# Patient Record
Sex: Male | Born: 1955 | Race: White | Hispanic: Yes | Marital: Married | Smoking: Never smoker
Health system: Southern US, Community
[De-identification: ages and names within clinical notes are randomized; demographics above are authoritative.]

## PROBLEM LIST (undated history)

## (undated) DIAGNOSIS — N400 Enlarged prostate without lower urinary tract symptoms: Secondary | ICD-10-CM

## (undated) DIAGNOSIS — N200 Calculus of kidney: Secondary | ICD-10-CM

## (undated) HISTORY — DX: Benign prostatic hyperplasia without lower urinary tract symptoms: N40.0

## (undated) HISTORY — DX: Calculus of kidney: N20.0

---

## 2015-10-09 ENCOUNTER — Encounter (HOSPITAL_COMMUNITY): Payer: Self-pay

## 2015-10-09 ENCOUNTER — Emergency Department (HOSPITAL_COMMUNITY)
Admission: EM | Admit: 2015-10-09 | Discharge: 2015-10-09 | Disposition: A | Discharge status: Home / Self Care | Payer: BLUE CROSS/BLUE SHIELD | Attending: Emergency Medicine | Admitting: Emergency Medicine

## 2015-10-09 ENCOUNTER — Emergency Department (HOSPITAL_COMMUNITY): Payer: BLUE CROSS/BLUE SHIELD

## 2015-10-09 DIAGNOSIS — R109 Unspecified abdominal pain: Secondary | ICD-10-CM | POA: Diagnosis not present

## 2015-10-09 DIAGNOSIS — N200 Calculus of kidney: Secondary | ICD-10-CM | POA: Insufficient documentation

## 2015-10-09 LAB — COMPREHENSIVE METABOLIC PANEL
ALK PHOS: 58 U/L (ref 38–126)
ALT: 37 U/L (ref 17–63)
AST: 29 U/L (ref 15–41)
Albumin: 4.2 g/dL (ref 3.5–5.0)
Anion gap: 6 (ref 5–15)
BUN: 14 mg/dL (ref 6–20)
CALCIUM: 9 mg/dL (ref 8.9–10.3)
CHLORIDE: 105 mmol/L (ref 101–111)
CO2: 29 mmol/L (ref 22–32)
CREATININE: 1.29 mg/dL — AB (ref 0.61–1.24)
GFR calc non Af Amer: 59 mL/min — ABNORMAL LOW (ref >60)
Glucose, Bld: 109 mg/dL — ABNORMAL HIGH (ref 65–99)
Potassium: 3.8 mmol/L (ref 3.5–5.1)
SODIUM: 140 mmol/L (ref 135–145)
Total Bilirubin: 0.7 mg/dL (ref 0.3–1.2)
Total Protein: 7.1 g/dL (ref 6.5–8.1)

## 2015-10-09 LAB — URINE MICROSCOPIC-ADD ON

## 2015-10-09 LAB — URINALYSIS, ROUTINE W REFLEX MICROSCOPIC
BILIRUBIN URINE: NEGATIVE
GLUCOSE, UA: NEGATIVE mg/dL
KETONES UR: NEGATIVE mg/dL
Leukocytes, UA: NEGATIVE
Nitrite: NEGATIVE
PROTEIN: NEGATIVE mg/dL
Specific Gravity, Urine: 1.017 (ref 1.005–1.030)
pH: 7.5 (ref 5.0–8.0)

## 2015-10-09 LAB — CBC
HCT: 47.2 % (ref 39.0–52.0)
Hemoglobin: 15.1 g/dL (ref 13.0–17.0)
MCH: 31.1 pg (ref 26.0–34.0)
MCHC: 32 g/dL (ref 30.0–36.0)
MCV: 97.3 fL (ref 78.0–100.0)
PLATELETS: 200 10*3/uL (ref 150–400)
RBC: 4.85 MIL/uL (ref 4.22–5.81)
RDW: 12.2 % (ref 11.5–15.5)
WBC: 11.2 10*3/uL — ABNORMAL HIGH (ref 4.0–10.5)

## 2015-10-09 LAB — LIPASE, BLOOD: Lipase: 38 U/L (ref 11–51)

## 2015-10-09 MED ORDER — OXYCODONE-ACETAMINOPHEN 5-325 MG PO TABS
1.0000 | ORAL_TABLET | ORAL | Status: DC | PRN
  Administered 2015-10-09: 1 via ORAL

## 2015-10-09 MED ORDER — ONDANSETRON 4 MG PO TBDP: 4.0000 mg | ORAL_TABLET | Freq: Three times a day (TID) | ORAL | 0 refills | Status: DC | PRN

## 2015-10-09 MED ORDER — HYDROCODONE-ACETAMINOPHEN 5-325 MG PO TABS: 1.0000 | ORAL_TABLET | ORAL | 0 refills | Status: DC | PRN

## 2015-10-09 MED ORDER — SODIUM CHLORIDE 0.9 % IV BOLUS (SEPSIS)
1000.0000 mL | Freq: Once | INTRAVENOUS | Status: AC
  Administered 2015-10-09: 1000 mL via INTRAVENOUS

## 2015-10-09 MED ORDER — MORPHINE SULFATE (PF) 4 MG/ML IV SOLN
4.0000 mg | Freq: Once | INTRAVENOUS | Status: AC
  Administered 2015-10-09: 4 mg via INTRAVENOUS
  Filled 2015-10-09: qty 1

## 2015-10-09 MED ORDER — ONDANSETRON HCL 4 MG/2ML IJ SOLN
4.0000 mg | Freq: Once | INTRAMUSCULAR | Status: AC
  Administered 2015-10-09: 4 mg via INTRAVENOUS
  Filled 2015-10-09: qty 2

## 2015-10-09 MED ORDER — OXYCODONE-ACETAMINOPHEN 5-325 MG PO TABS
ORAL_TABLET | ORAL | Status: AC
  Filled 2015-10-09: qty 1

## 2015-10-09 MED ORDER — HYDROMORPHONE HCL 1 MG/ML IJ SOLN: 1.0000 mg | Freq: Once | INTRAMUSCULAR | Status: DC

## 2015-10-09 NOTE — ED Provider Notes (Signed)
[facility]-EMER[facility]ENCY DEPT Provider Note   CSN: [id number] Arrival date & time: 10/09/15  1729     History   Chief Complaint Chief Complaint  Patient presents with  . Flank Pain    HPI Darren Nelson is a 60 y.o. male.  Pt has a hx of kidney stones and has had right sided flank pain since yesterday.  He took ibuprofen without help.  He has had some n/v.      History reviewed. No pertinent past medical history.  There are no active problems to display for this patient.   History reviewed. No pertinent surgical history.     Home Medications    Prior to Admission medications   Medication Sig Start Date End Date Taking? Authorizing Provider  HYDROcodone-acetaminophen (NORCO/VICODIN) 5-325 MG tablet Take 1 tablet by mouth every 4 (four) hours as needed. 10/09/15   Jacalyn LefevreJulie Sohail Capraro, MD  ondansetron (ZOFRAN ODT) 4 MG disintegrating tablet Take 1 tablet (4 mg total) by mouth every 8 (eight) hours as needed for nausea or vomiting. 10/09/15   Jacalyn LefevreJulie Oshen Wlodarczyk, MD    Family History History reviewed. No pertinent family history.  Social History Social History  Substance Use Topics  . Smoking status: Never Smoker  . Smokeless tobacco: Never Used  . Alcohol use No     Allergies   Review of patient's allergies indicates no known allergies.   Review of Systems Review of Systems  Gastrointestinal: Positive for abdominal pain, nausea and vomiting.  Genitourinary: Positive for flank pain.  All other systems reviewed and are negative.    Physical Exam Updated Vital Signs BP 122/77   Pulse 62   Temp 97.8 F (36.6 C) (Oral)   Resp 18   SpO2 98%   Physical Exam  Constitutional: He is oriented to person, place, and time. He appears well-developed and well-nourished. He appears distressed.  HENT:  Head: Normocephalic and atraumatic.  Right Ear: External ear normal.  Left Ear: External ear normal.  Nose: Nose normal.  Mouth/Throat: Oropharynx is clear and moist.    Eyes: Conjunctivae and EOM are normal. Pupils are equal, round, and reactive to light.  Neck: Normal range of motion. Neck supple.  Cardiovascular: Normal rate, regular rhythm, normal heart sounds and intact distal pulses.   Pulmonary/Chest: Effort normal and breath sounds normal.  Abdominal: Soft. Bowel sounds are normal.  Musculoskeletal: Normal range of motion.  Neurological: He is alert and oriented to person, place, and time.  Skin: Skin is warm and dry.  Psychiatric: He has a normal mood and affect. His behavior is normal. Judgment and thought content normal.  Nursing note and vitals reviewed.    ED Treatments / Results  Labs (all labs ordered are listed, but only abnormal results are displayed) Labs Reviewed  COMPREHENSIVE METABOLIC PANEL - Abnormal; Notable for the following:       Result Value   Glucose, Bld 109 (*)    Creatinine, Ser 1.29 (*)    GFR calc non Af Amer 59 (*)    All other components within normal limits  CBC - Abnormal; Notable for the following:    WBC 11.2 (*)    All other components within normal limits  URINALYSIS, ROUTINE W REFLEX MICROSCOPIC (NOT AT [facility]) - Abnormal; Notable for the following:    Hgb urine dipstick LARGE (*)    All other components within normal limits  URINE MICROSCOPIC-ADD ON - Abnormal; Notable for the following:    Squamous Epithelial / LPF 0-5 (*)  Bacteria, UA RARE (*)    All other components within normal limits  LIPASE, BLOOD    EKG  EKG Interpretation None       Radiology Ct Renal Stone Study  Result Date: 10/09/2015 CLINICAL DATA:  Right flank pain and vomiting since this morning. Initial encounter. EXAM: CT ABDOMEN AND PELVIS WITHOUT CONTRAST TECHNIQUE: Multidetector CT imaging of the abdomen and pelvis was performed following the standard protocol without IV contrast. COMPARISON:  None. FINDINGS: There is dependent atelectasis in the lung bases. Heart size is enlarged. No pleural or pericardial effusion. 2-3  punctate nonobstructing right renal stones are identified. Multiple small nonobstructing left renal stones are also seen. Stones on the left measure up to 0.4 cm in diameter. There is mild stranding about the right ureter but no ureteral stone is identified and there is only minimal fullness of the right intrarenal collecting system. A 0.4 cm stone is seen lying dependently within the urinary bladder. No left ureteral stones are identified. The gallbladder, liver, spleen, adrenal glands and pancreas appear normal. Scattered aortoiliac atherosclerosis without aneurysm is identified. Prostate gland and seminal vesicles are unremarkable. The stomach, small and large bowel and appendix appear normal. No lymphadenopathy or fluid. No focal bony abnormality. IMPRESSION: Findings consistent with recent passage of a right ureteral stone with a 0.4 cm stone lying dependently within the urinary bladder and mild fullness of the right intrarenal collecting system. Bilateral nonobstructing renal stones. Scattered aortoiliac atherosclerosis. Electronically Signed   By: Drusilla Kanner M.D.   On: 10/09/2015 20:31    Procedures Procedures (including critical care time)  Medications Ordered in ED Medications  oxyCODONE-acetaminophen (PERCOCET/ROXICET) 5-325 MG per tablet 1 tablet (1 tablet Oral Given 10/09/15 1743)  sodium chloride 0.9 % bolus 1,000 mL (1,000 mLs Intravenous New Bag/Given 10/09/15 1950)  morphine 4 MG/ML injection 4 mg (4 mg Intravenous Given 10/09/15 1950)  ondansetron (ZOFRAN) injection 4 mg (4 mg Intravenous Given 10/09/15 1950)     Initial Impression / Assessment and Plan / ED Course  I have reviewed the triage vital signs and the nursing notes.  Pertinent labs & imaging results that were available during my care of the patient were reviewed by me and considered in my medical decision making (see chart for details).  Clinical Course   Pt is feeling much better.  It looks like his stone is in his  bladder.  Pt knows to return if worse.  He is given the number to urology to f/u. Final Clinical Impressions(s) / ED Diagnoses   Final diagnoses:  Kidney stone    New Prescriptions New Prescriptions   HYDROCODONE-ACETAMINOPHEN (NORCO/VICODIN) 5-325 MG TABLET    Take 1 tablet by mouth every 4 (four) hours as needed.   ONDANSETRON (ZOFRAN ODT) 4 MG DISINTEGRATING TABLET    Take 1 tablet (4 mg total) by mouth every 8 (eight) hours as needed for nausea or vomiting.     Jacalyn Lefevre, MD 10/09/15 2055

## 2015-10-09 NOTE — ED Notes (Signed)
Patient Alert and oriented X4. Stable and ambulatory. Patient verbalized understanding of the discharge instructions.  Patient belongings were taken by the patient.  

## 2015-10-09 NOTE — ED Triage Notes (Signed)
Pt here with family, reports right side flank pain. Pt took ibuprofen with no relief. Family reports 1 episode of vomiting. Pt denies medical hx.

## 2015-10-09 NOTE — ED Notes (Signed)
Pt went to CT

## 2017-07-01 ENCOUNTER — Encounter: Payer: Self-pay | Admitting: Family Medicine

## 2017-07-01 ENCOUNTER — Ambulatory Visit: Payer: BLUE CROSS/BLUE SHIELD | Admitting: Family Medicine

## 2017-07-01 ENCOUNTER — Other Ambulatory Visit: Payer: Self-pay

## 2017-07-01 VITALS — BP 130/94 | HR 74 | Temp 98.6°F | Ht 68.5 in | Wt 221.8 lb

## 2017-07-01 DIAGNOSIS — M545 Low back pain, unspecified: Secondary | ICD-10-CM | POA: Insufficient documentation

## 2017-07-01 DIAGNOSIS — N200 Calculus of kidney: Secondary | ICD-10-CM

## 2017-07-01 DIAGNOSIS — K219 Gastro-esophageal reflux disease without esophagitis: Secondary | ICD-10-CM | POA: Diagnosis not present

## 2017-07-01 DIAGNOSIS — R413 Other amnesia: Secondary | ICD-10-CM | POA: Insufficient documentation

## 2017-07-01 DIAGNOSIS — G8929 Other chronic pain: Secondary | ICD-10-CM | POA: Insufficient documentation

## 2017-07-01 LAB — POCT URINALYSIS DIP (MANUAL ENTRY)
BILIRUBIN UA: NEGATIVE mg/dL
Bilirubin, UA: NEGATIVE
Blood, UA: NEGATIVE
GLUCOSE UA: NEGATIVE mg/dL
LEUKOCYTES UA: NEGATIVE
Nitrite, UA: NEGATIVE
Protein Ur, POC: NEGATIVE mg/dL
SPEC GRAV UA: 1.02 (ref 1.010–1.025)
UROBILINOGEN UA: 0.2 U/dL
pH, UA: 5.5 (ref 5.0–8.0)

## 2017-07-01 MED ORDER — OMEPRAZOLE 20 MG PO CPDR: 20.0000 mg | DELAYED_RELEASE_CAPSULE | Freq: Every day | ORAL | 3 refills | Status: DC

## 2017-07-01 NOTE — Progress Notes (Signed)
   Subjective:    Darren Nelson - 62 y.o. male MRN [id number]  Date of birth: 1956/01/26  HPI  Darren Nelson is here to establish care.  Current concerns include stomach pain that he attributes to Tamsulosin.  He also complains of memory problems, saying that he has gotten lost in familiar places and has become less proficient at tasks he used to perform well.  He also has back pain and would like omeprazole for his stomach pain.  His back pain radiates to his right leg.  He was diagnosed with a kidney stone in the past, and he and his wife think that his back pain is due to his spine and a recurring kidney stone.  PMH: BPH, kidney stones  FH: kidney stones, diabetes  Meds: Tamsulosin  Social Hx: smoked cigarettes for 15 years, quit 25 years ago.  Also quit drinking about five years ago.  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  Health Maintenance: Health Maintenance Due  Topic Date Due  . Hepatitis C Screening  January 27, 1956  . HIV Screening  07/02/1971  . TETANUS/TDAP  07/02/1975  . COLONOSCOPY  07/02/2006    -  reports that he has never smoked. He has never used smokeless tobacco. - Review of Systems: Per HPI. - Past Medical History: Patient Active Problem List   Diagnosis Date Noted  . Kidney stones 07/01/2017  . Gastroesophageal reflux disease 07/01/2017  . Chronic bilateral low back pain 07/01/2017  . Memory loss 07/01/2017   - Medications: reviewed and updated   Objective:   Physical Exam BP (!) 130/94   Pulse 74   Temp 98.6 F (37 C) (Oral)   Ht 5' 8.5" (1.74 m)   Wt 221 lb 12.8 oz (100.6 kg)   SpO2 98%   BMI 33.23 kg/m  Gen: NAD, alert, cooperative with exam, well-appearing HEENT: NCAT, PERRL, clear conjunctiva but with arcus senilis, oropharynx clear CV: RRR, good S1/S2, no murmur, no edema  Resp: CTABL, no wheezes, non-labored Abd: SNTND, BS present, no guarding or organomegaly Skin: no rashes, normal turgor  Neuro:  no gross deficits.  Psych: good insight, alert and oriented        Assessment & Plan:   Gastroesophageal reflux disease Will prescribe omeprazole 20 mg daily.  At next visit, will go over side effects of long term use of this medication and discontinue if it has not been helpful.  Kidney stones Advised patient that his previous kidney stone had likely passed, but we will perform a UA to check for presence of blood indicating a current kidney stone today.  Memory loss Will need to devote another clinic visit to this issue and perform neurocognitive testing at that time.  Chronic bilateral low back pain Advised patient that this may be due to osteoarthritis given his age.  Gave him back exercises in Spanish to see if these alleviate pain.  Encouraged use of tylenol as needed for pain relief but told him to avoid NSAIDs due to his stomach pains.    Lezlie Octave, M.D. 07/01/2017, 5:32 PM PGY-1, [facility]

## 2017-07-01 NOTE — Assessment & Plan Note (Signed)
Advised patient that this may be due to osteoarthritis given his age.  Gave him back exercises in Spanish to see if these alleviate pain.  Encouraged use of tylenol as needed for pain relief but told him to avoid NSAIDs due to his stomach pains.

## 2017-07-01 NOTE — Patient Instructions (Addendum)
Mucho gusto Sr. Darren Hausen!  Por favor toma una pastilla de omeprazole cada dia.  Voy a llamarle con las resultados de su examen de orina si no son normales.  Quiero verle en una mes para hablar de su memoria and para mas examenes.  Gracias, Dr. Frances Furbish Ejercicios para la espalda (Back Exercises) Los siguientes ejercicios fortalecen los msculos que dan soporte a la espalda y, [place], ayudan a Pharmacologist la flexibilidad de la zona lumbar. Hacer estos ejercicios puede ser de ayuda para Psychologist, sport and exercise de espalda o Engineer, materials actual. Si tiene dolor o molestias en la espalda, intente hacer estos ejercicios 2 o 3veces por da, o como se lo haya indicado el mdico. Cuando el dolor desaparezca, hgalos una vez por da, pero haga ms repeticiones de cada ejercicio. Si no tiene dolor o [employer], haga estos ejercicios una vez por da o como se lo haya indicado el mdico. EJERCICIOS Rodilla al pecho Repita estos pasos 3 o 5veces con cada pierna: 1. Acustese boca arriba sobre una cama dura o sobre el suelo con las piernas extendidas. 2. Lleve una rodilla al pecho. La otra pierna debe quedar extendida y en contacto con el suelo. 3. Mantenga la rodilla contra el pecho. Para lograrlo tmese la rodilla o el muslo. 4. Tire de la rodilla hasta sentir una elongacin suave en la parte baja de la espalda. 5. Mantenga la elongacin durante 10 a 30segundos. 6. Suelte y extienda la pierna lentamente. Inclinacin de la pelvis Repita estos pasos 5 o 10veces: 1. Acustese boca arriba sobre una cama dura o sobre el suelo con las piernas extendidas. 2. Flexione las rodillas de modo que apunten al techo y los pies queden apoyados en el suelo. 3. Contraiga los msculos de la parte baja del abdomen para empujar la zona lumbar contra el suelo. Con este movimiento se inclinar la pelvis de modo que el cccix apunte hacia el techo, en lugar de apuntar en direccin a los pies o al  suelo. 4. Contraiga suavemente y respire con normalidad mientras mantiene esta posicin durante 5 a 10segundos. El perro y el gato Repita estos pasos hasta que la zona lumbar se vuelva ms flexible: 1. Apoye las palmas de las manos y las rodillas sobre una superficie firme. Las manos deben estar alineadas con los hombros y las rodillas con las caderas. Puede colocarse almohadillas debajo de las rodillas para estar cmodo. 2. Deje caer la cabeza y baje el cccix en direccin al suelo de modo que la zona lumbar se arquee como el lomo de un gato [place]. 3. Mantenga esta posicin durante 5segundos. 4. Lentamente, levante la cabeza y eleve el cccix de modo que apunte en direccin al techo para que la espalda forme un arco hundido como el lomo de un perro contento. 5. Mantenga esta posicin durante 5segundos. Flexiones de brazos Repita estos pasos 5 o 10veces: 1. Acustese boca abajo en el suelo. 2. Coloque las palmas de las manos cerca de la cabeza, separadas aproximadamente al ancho de los hombros. 3. Con la espalda lo ms relajada posible y las caderas apoyadas en el suelo, extienda lentamente los brazos para levantar la mitad superior del cuerpo y Optometrist los hombros. No use los msculos de la espalda para elevar la parte superior del torso. Puede cambiar las manos de lugar para estar ms cmodo. 4. Mantenga esta posicin durante 5segundos mientras mantiene la espalda relajada. 5. Lentamente vuelva a la posicin horizontal. Puentes Repita estos pasos 10veces:  1. Acustese boca arriba sobre una superficie firme. 2. Flexione las rodillas de modo que apunten al techo y los pies queden apoyados en el suelo. 3. Contraiga los glteos y despegue las nalgas del suelo hasta que la cintura est casi a la misma altura que las rodillas. Debe sentir el trabajo muscular en las nalgas y la parte posterior de los muslos. Si no siente el esfuerzo de [employer], aleje los pies 1 o 2pulgadas (2,5 o  5centmetros) de las nalgas. 4. Mantenga esta posicin durante 3 a 5segundos. 5. Baje lentamente las caderas a la posicin inicial y relaje los glteos por completo. Si este ejercicio le resulta muy fcil, intente realizarlo con los brazos cruzados [place]. Abdominales Repita estos pasos 5 o 10veces: 1. Acustese boca arriba sobre una cama dura o sobre el suelo con las piernas extendidas. 2. Flexione las rodillas de modo que apunten al techo y los pies queden apoyados en el suelo. 3. Cruce los [employer]. 4. Baje levemente el mentn en direccin al pecho sin doblar el cuello. 5. Contraiga los msculos del abdomen y con lentitud eleve el torso lo suficiente como para Artist los omplatos del suelo. No eleve el torso ms alto que eso, porque esto puede sobreexigir a la zona lumbar y no ayuda a Investment banker, operational. 6. Regrese lentamente a la posicin inicial. Elevaciones de espalda Repita estos pasos 5 o 10veces: 1. Acustese boca abajo con los brazos a los costados del cuerpo y apoye la frente en el suelo. 2. Contraiga los msculos de las piernas y los glteos. 3. Lentamente despegue el pecho del suelo [employer] las caderas bien apoyadas en el suelo. Mantenga la nuca alineada con la curvatura de la espalda. Los ojos deben mirar al suelo. 4. Mantenga esta posicin durante 3 a 5segundos. 5. Regrese lentamente a la posicin inicial. SOLICITE ATENCIN MDICA SI:  El dolor o las molestias en la espalda se vuelven mucho ms intensos cuando hace un ejercicio.  El dolor o las molestias en la espalda no se Copy en el trmino de las 2horas posteriores a Copy. Si tiene alguno de [employer], deje de [employer] ejercicios de inmediato. No vuelva a hacer los ejercicios a menos que el mdico lo autorice. SOLICITE ATENCIN MDICA DE INMEDIATO SI:  Siente un dolor sbito e intenso en la espalda. Si esto ocurre, deje de [employer] ejercicios de inmediato. No vuelva a hacer los ejercicios a menos que el mdico lo autorice.  Esta informacin no tiene Theme park manager el consejo del mdico. Asegrese de hacerle al mdico cualquier pregunta que tenga. Document Released: 01/21/2005 Document Revised: 10/12/2014 Document Reviewed: 03/17/2014 Elsevier Interactive Patient Education  2017 [employer].

## 2017-07-01 NOTE — Assessment & Plan Note (Signed)
Will prescribe omeprazole 20 mg daily.  At next visit, will go over side effects of long term use of this medication and discontinue if it has not been helpful.

## 2017-07-01 NOTE — Assessment & Plan Note (Signed)
Advised patient that his previous kidney stone had likely passed, but we will perform a UA to check for presence of blood indicating a current kidney stone today.

## 2017-07-01 NOTE — Assessment & Plan Note (Signed)
Will need to devote another clinic visit to this issue and perform neurocognitive testing at that time.

## 2017-07-02 ENCOUNTER — Encounter: Payer: Self-pay | Admitting: *Deleted

## 2017-07-14 ENCOUNTER — Other Ambulatory Visit: Payer: Self-pay

## 2017-07-14 ENCOUNTER — Encounter: Payer: Self-pay | Admitting: Internal Medicine

## 2017-07-14 ENCOUNTER — Ambulatory Visit: Payer: BLUE CROSS/BLUE SHIELD | Admitting: Internal Medicine

## 2017-07-14 DIAGNOSIS — G8929 Other chronic pain: Secondary | ICD-10-CM

## 2017-07-14 DIAGNOSIS — M545 Low back pain: Secondary | ICD-10-CM | POA: Diagnosis not present

## 2017-07-14 MED ORDER — IBUPROFEN 600 MG PO TABS: 600.0000 mg | ORAL_TABLET | Freq: Three times a day (TID) | ORAL | 0 refills | Status: AC

## 2017-07-14 MED ORDER — CYCLOBENZAPRINE HCL 10 MG PO TABS: 10.0000 mg | ORAL_TABLET | Freq: Three times a day (TID) | ORAL | 0 refills | Status: DC | PRN

## 2017-07-14 NOTE — Patient Instructions (Signed)
It was nice meeting you today Mr. Darren Nelson!  For your back pain, please begin taking one ibuprofen tablet every 8 hours for the next 5 days. You can also take one Flexeril tablet (muscle relaxer) up to every 8 hours, however this medication may make you tired. You can use a heating pad or ice pack on your back if this helps with your pain. Try to stay as active as possible, however try to avoid any heavy lifting over the next week or so.   If your back pain worsens, or if you develop any numbness or weakness in your legs, please call to let us know or go to the emergency room.   If you have any questions or concerns, please feel free to call the clinic.   Be well,  Dr. Natale MilchLancaster

## 2017-07-14 NOTE — Assessment & Plan Note (Signed)
Acute on chronic likely 2/2 MSK injury after lifting heavy tree branch. No red flags. Will treat conservatively with scheduled ibuprofen over next 5d (no longer reporting abd pain and is now taking omeprazole daily), as well as Flexeril PRN. Discussed return precautions.

## 2017-07-14 NOTE — Progress Notes (Signed)
   Subjective:   Patient: Darren Nelson       Birthdate: September 01, 1955       MRN: [id number]      HPI  Darren Nelson is a 62 y.o. male presenting for walk in appt for back pain.   Back pain Began 1w ago after lifting a heavy tree branch. Located in lower back bilaterally. Has not taken any medications for pain. Has not used heating pad or ice packs. Denies numbness or weakness of LE, incontinence. Has been wearing an OTC back brace which improves pain some. Is having difficulty walking and sitting due to pain. Does have history of chronic bilateral low back pain, though current episode of significantly worse than typical pain.  Smoking status reviewed. Patient is never smoker.   Review of Systems See HPI.     Objective:  Physical Exam  Constitutional: He is oriented to person, place, and time.  HENT:  Head: Normocephalic and atraumatic.  Pulmonary/Chest: Effort normal. No respiratory distress.  Musculoskeletal:  Sits, stands, and walks slowly. Wife assists him with standing.  Some TTP of paraspinal region, R>L, though generalized TTP along lumbar region.  Back ROM diminished due to pain.  5/5 strength LE bilaterally.   Neurological: He is alert and oriented to person, place, and time.  Psychiatric: He has a normal mood and affect. His behavior is normal.      Assessment & Plan:  Chronic bilateral low back pain Acute on chronic likely 2/2 MSK injury after lifting heavy tree branch. No red flags. Will treat conservatively with scheduled ibuprofen over next 5d (no longer reporting abd pain and is now taking omeprazole daily), as well as Flexeril PRN. Discussed return precautions.    Tarri AbernethyAbigail J Chrisma Hurlock, MD, MPH PGY-3 [facility] Pager [phone]

## 2017-07-31 ENCOUNTER — Encounter: Payer: Self-pay | Admitting: Family Medicine

## 2017-07-31 ENCOUNTER — Other Ambulatory Visit: Payer: Self-pay

## 2017-07-31 ENCOUNTER — Ambulatory Visit: Payer: BLUE CROSS/BLUE SHIELD | Admitting: Family Medicine

## 2017-07-31 DIAGNOSIS — R413 Other amnesia: Secondary | ICD-10-CM

## 2017-07-31 DIAGNOSIS — G8929 Other chronic pain: Secondary | ICD-10-CM | POA: Diagnosis not present

## 2017-07-31 DIAGNOSIS — M5441 Lumbago with sciatica, right side: Secondary | ICD-10-CM

## 2017-07-31 MED ORDER — OMEPRAZOLE 20 MG PO CPDR: 20.0000 mg | DELAYED_RELEASE_CAPSULE | Freq: Every day | ORAL | 3 refills | Status: DC

## 2017-07-31 MED ORDER — IBUPROFEN 600 MG PO TABS: 600.0000 mg | ORAL_TABLET | Freq: Two times a day (BID) | ORAL | 3 refills | Status: DC

## 2017-07-31 MED ORDER — GABAPENTIN 300 MG PO CAPS
300.0000 mg | ORAL_CAPSULE | Freq: Two times a day (BID) | ORAL | 3 refills | Status: DC
Start: 2017-07-31 — End: 2017-12-19

## 2017-07-31 NOTE — Progress Notes (Signed)
   Subjective:    Darren Nelson - 62 y.o. male MRN [id number]  Date of birth: 1955-10-12  HPI - history obtained with help of Spanish interpretor  Darren Nelson is here for back pain and to discuss his memory problems.  Back pain: started after lifting a heavy piece of wood about one month ago.  Pain is located in his lower back and radiates down his posterior R thigh to his knee.  He has been taking Motrin 600 mg TID, which helps alleviate his pain.  He has also tried using a massage machine, which also temporarily alleviates his pain.  No bowel or bladder incontinence, no numbness.  He rates the pain as an 8/10.  He works M-F as a Geophysicist/field seismologistgardner and has significant back pain by the end of his work day.  Memory loss: patient and his wife are worried that he is losing his memory since he often needs to be reminded of things and is not as skilled at doing tasks as he used to be.     Health Maintenance:  Health Maintenance Due  Topic Date Due  . Hepatitis C Screening  01958-09-08  . HIV Screening  07/02/1971  . COLONOSCOPY  07/02/2006    -  reports that he has never smoked. He has never used smokeless tobacco. - Review of Systems: Per HPI. - Past Medical History: Patient Active Problem List   Diagnosis Date Noted  . Kidney stones 07/01/2017  . Gastroesophageal reflux disease 07/01/2017  . Chronic bilateral low back pain 07/01/2017  . Memory loss 07/01/2017   - Medications: reviewed and updated   Objective:   Physical Exam BP 122/80 (BP Location: Left Arm, Patient Position: Sitting, Cuff Size: Large)   Pulse 83   Temp 98 F (36.7 C) (Oral)   Ht 5\' 7"  (1.702 m)   Wt 223 lb (101.2 kg)   SpO2 98%   BMI 34.93 kg/m  Gen: NAD, alert, cooperative with exam, obese, pleasant HEENT: NCAT, conjunctiva slightly injected, supple neck CV: RRR, good S1/S2, no murmur, no edema  Resp: CTABL, no wheezes, non-labored Abd: SNTND, BS present, no guarding or organomegaly Skin: no  rashes, normal turgor  Neuro: no gross deficits.  Musculoskeletal: spine and paraspinal muscles tender to palpation in lumbar region, especially on the right side.  Normal ROM in lumbar spine.  Positive straight leg test bilaterally, although this could be due to tight hamstrings. Psych: good insight, alert and oriented, no signs of memory loss in conversation        Assessment & Plan:   Chronic bilateral low back pain Could be due to arthritis, sciatica, or a ruptured disk.  The management for these conditions is conservative, so will prescribe gabapentin 300 mg BID and recommend home exercises (patient does not have time for PT since he works every day of the work week).  Will reduce his motrin to 600 mg BID due to his acid reflux and to protect his kidneys.  Would increase gabapentin dose, possibly obtain and x-ray then MRI if pain does not improve in four weeks.  Memory loss Clock drawing shows potential cognitive decline (see media tab), but patient would benefit from further testing for a more accurate diagnosis.  He is young to develop dementia, but that is still a possibility.  Language barrier and low education level may impede accuracy of testing.    Darren Nelson, M.D. 08/02/2017, 11:46 AM PGY-1, [facility]

## 2017-07-31 NOTE — Patient Instructions (Addendum)
Por favor reduce Motrin a 400 mg dos veces cada dia, porque este causa mas dolor del estomago y puede da~nar sus ri~nones.  Por favor empieza la gabapentin dos veces cada dia para su dolor.  Ejercicios para la espalda (Back Exercises) Los siguientes ejercicios fortalecen los msculos que dan soporte a la espalda y, [place], ayudan a Pharmacologist la flexibilidad de la zona lumbar. Hacer estos ejercicios puede ser de ayuda para Psychologist, sport and exercise de espalda o Engineer, materials actual. Si tiene dolor o molestias en la espalda, intente hacer estos ejercicios 2 o 3veces por da, o como se lo haya indicado el mdico. Cuando el dolor desaparezca, hgalos una vez por da, pero haga ms repeticiones de cada ejercicio. Si no tiene dolor o QUALCOMM, haga estos ejercicios una vez por da o como se lo haya indicado el mdico. EJERCICIOS Rodilla al pecho Repita estos pasos 3 o 5veces con cada pierna: 1. Acustese boca arriba sobre una cama dura o sobre el suelo con las piernas extendidas. 2. Lleve una rodilla al pecho. La otra pierna debe quedar extendida y en contacto con el suelo. 3. Mantenga la rodilla contra el pecho. Para lograrlo tmese la rodilla o el muslo. 4. Tire de la rodilla hasta sentir una elongacin suave en la parte baja de la espalda. 5. Mantenga la elongacin durante 10 a 30segundos. 6. Suelte y extienda la pierna lentamente. Inclinacin de la pelvis Repita estos pasos 5 o 10veces: 1. Acustese boca arriba sobre una cama dura o sobre el suelo con las piernas extendidas. 2. Flexione las rodillas de modo que apunten al techo y los pies queden apoyados en el suelo. 3. Contraiga los msculos de la parte baja del abdomen para empujar la zona lumbar contra el suelo. Con este movimiento se inclinar la pelvis de modo que el cccix apunte hacia el techo, en lugar de apuntar en direccin a los pies o al suelo. 4. Contraiga suavemente y respire con normalidad mientras mantiene esta posicin  durante 5 a 10segundos. El perro y el gato Repita estos pasos hasta que la zona lumbar se vuelva ms flexible: 1. Apoye las palmas de las manos y las rodillas sobre una superficie firme. Las manos deben estar alineadas con los hombros y las rodillas con las caderas. Puede colocarse almohadillas debajo de las rodillas para estar cmodo. 2. Deje caer la cabeza y baje el cccix en direccin al suelo de modo que la zona lumbar se arquee como el lomo de un gato [place]. 3. Mantenga esta posicin durante 5segundos. 4. Lentamente, levante la cabeza y eleve el cccix de modo que apunte en direccin al techo para que la espalda forme un arco hundido como el lomo de un perro contento. 5. Mantenga esta posicin durante 5segundos. Flexiones de brazos Repita estos pasos 5 o 10veces: 1. Acustese boca abajo en el suelo. 2. Coloque las palmas de las manos cerca de la cabeza, separadas aproximadamente al ancho de los hombros. 3. Con la espalda lo ms relajada posible y las caderas apoyadas en el suelo, extienda lentamente los brazos para levantar la mitad superior del cuerpo y Optometrist los hombros. No use los msculos de la espalda para elevar la parte superior del torso. Puede cambiar las manos de lugar para estar ms cmodo. 4. Mantenga esta posicin durante 5segundos mientras mantiene la espalda relajada. 5. Lentamente vuelva a la posicin horizontal. Puentes Repita estos pasos 10veces: 1. Acustese boca arriba sobre una superficie firme. 2. Flexione las rodillas de  modo que apunten al techo y los pies queden apoyados en el suelo. 3. Contraiga los glteos y despegue las nalgas del suelo hasta que la cintura est casi a la misma altura que las rodillas. Debe sentir el trabajo muscular en las nalgas y la parte posterior de los muslos. Si no siente el esfuerzo de [employer]Warner, aleje los pies 1 o 2pulgadas (2,5 o 5centmetros) de las nalgas. 4. Mantenga esta posicin durante 3 a 5segundos. 5. Baje  lentamente las caderas a la posicin inicial y relaje los glteos por completo. Si este ejercicio le resulta muy fcil, intente realizarlo con los brazos cruzados [place]. Abdominales Repita estos pasos 5 o 10veces: 1. Acustese boca arriba sobre una cama dura o sobre el suelo con las piernas extendidas. 2. Flexione las rodillas de modo que apunten al techo y los pies queden apoyados en el suelo. 3. Cruce los [employer] Services Corporation. 4. Baje levemente el mentn en direccin al pecho sin doblar el cuello. 5. Contraiga los msculos del abdomen y con lentitud eleve el torso lo suficiente como para Artist los omplatos del suelo. No eleve el torso ms alto que eso, porque esto puede sobreexigir a la zona lumbar y no ayuda a Investment banker, operational. 6. Regrese lentamente a la posicin inicial. Elevaciones de espalda Repita estos pasos 5 o 10veces: 1. Acustese boca abajo con los brazos a los costados del cuerpo y apoye la frente en el suelo. 2. Contraiga los msculos de las piernas y los glteos. 3. Lentamente despegue el pecho del suelo [employer]otive las caderas bien apoyadas en el suelo. Mantenga la nuca alineada con la curvatura de la espalda. Los ojos deben mirar al suelo. 4. Mantenga esta posicin durante 3 a 5segundos. 5. Regrese lentamente a la posicin inicial. SOLICITE ATENCIN MDICA SI:  El dolor o las molestias en la espalda se vuelven mucho ms intensos cuando hace un ejercicio.  El dolor o las molestias en la espalda no se Copy en el trmino de las 2horas posteriores a Copy. Si tiene alguno de [employer] Brands, deje de [employer] Corporation ejercicios de inmediato. No vuelva a hacer los ejercicios a menos que el mdico lo autorice. SOLICITE ATENCIN MDICA DE INMEDIATO SI:  Siente un dolor sbito e intenso en la espalda. Si esto ocurre, deje de [employer]AM[employer] Corporation ejercicios de inmediato. No vuelva a hacer los ejercicios a menos que el mdico lo  autorice.  Esta informacin no tiene Theme park manager el consejo del mdico. Asegrese de hacerle al mdico cualquier pregunta que tenga. Document Released: 01/21/2005 Document Revised: 10/12/2014 Document Reviewed: 03/17/2014 Elsevier Interactive Patient Education  2017 Elsevier Inc.  Dolor de espalda en adultos (Back Pain, Adult) El dolor de espalda es muy frecuente. A menudo mejora con el tiempo. La causa del dolor de espalda generalmente no es peligrosa. La Harley[employer]son de las personas puede aprender a Runner, broadcasting/film/video de espalda por s mismas. CUIDADOS EN EL HOGAR Controle su dolor de espalda a fin de Public house manager cambio. Las siguientes indicaciones ayudarn a Psychologist, clinical que pueda sentir:  Materials engineer. Comience con caminatas cortas sobre superficies planas si es posible. Trate de caminar un poco ms cada da.  Haga ejercicios con regularidad tal como le indic el mdico. El ejercicio ayuda a que su espalda se cure ms rpidamente. Tambin ayuda a prevenir futuras lesiones al [employer] fuertes y flexibles.  No se siente, conduzca ni permanezca de pie durante ms  de 30 minutos.  No permanezca en la cama. Si hace reposo ms de 1 a 2 das, puede demorar su recuperacin.  Sea cuidadoso al inclinarse o levantar un objeto. Use una tcnica apropiada para levantar peso: ? Flexione las rodillas. ? Mantenga el objeto cerca del cuerpo. ? No gire.  Duerma sobre un NVR Inccolchn firme. Recustese sobre un costado y flexione las rodillas. Si se recuesta Fisher Scientificsobre la espalda, coloque una almohada debajo de las rodillas.  Tome los medicamentos solamente como se lo haya indicado el mdico.  Aplique hielo sobre la zona lesionada. ? Ponga el hielo en una bolsa plstica. ? Coloque una FirstEnergy Corptoalla entre la piel y la bolsa de hielo. ? Deje el hielo durante 20minutos, 2 a 3veces por da, durante los primeros 2 o 3das. Despus de eso, puede alternar entre compresas de hielo y  Airline pilotcalor.  Evite sentir ansiedad o estrs. Encuentre maneras efectivas de lidiar con el estrs, Surveyor, miningcomo hacer ejercicio.  Mantenga un peso saludable. El peso excesivo ejerce tensin sobre la espalda. SOLICITE AYUDA SI:  Siente dolor que no se alivia con reposo o medicamentos.  Siente cada vez ms dolor que se extiende a las piernas o los glteos.  El dolor no mejora en una semana.  Siente dolor por la noche.  Pierde peso.  Siente escalofros o fiebre. SOLICITE AYUDA DE INMEDIATO SI:  No puede controlar su materia fecal (heces) o el pis (orina).  Siente debilidad en las piernas o los brazos.  Siente prdida de la sensibilidad (adormecimiento) en las piernas o los brazos.  Tiene malestar estomacal (nuseas) o vomita.  Siente dolor de estmago (abdominal).  Siente que se desvanece (se desmaya). Esta informacin no tiene Theme park managercomo fin reemplazar el consejo del mdico. Asegrese de hacerle al mdico cualquier pregunta que tenga. Document Released: 08/06/2010 Document Revised: 02/11/2014 Document Reviewed: 05/25/2013 Elsevier Interactive Patient Education  [employer]2018 Elsevier Inc.

## 2017-08-02 NOTE — Assessment & Plan Note (Signed)
Could be due to arthritis, sciatica, or a ruptured disk.  The management for these conditions is conservative, so will prescribe gabapentin 300 mg BID and recommend home exercises (patient does not have time for PT since he works every day of the work week).  Will reduce his motrin to 600 mg BID due to his acid reflux and to protect his kidneys.  Would increase gabapentin dose, possibly obtain and x-ray then MRI if pain does not improve in four weeks.

## 2017-08-02 NOTE — Assessment & Plan Note (Addendum)
Clock drawing shows potential cognitive decline (see media tab), but patient would benefit from further testing for a more accurate diagnosis.  He is young to develop dementia, but that is still a possibility.  Language barrier and low education level may impede accuracy of testing.

## 2017-09-09 ENCOUNTER — Ambulatory Visit: Payer: BLUE CROSS/BLUE SHIELD | Admitting: Family Medicine

## 2017-09-22 ENCOUNTER — Encounter: Payer: Self-pay | Admitting: Family Medicine

## 2017-09-22 ENCOUNTER — Ambulatory Visit: Payer: BLUE CROSS/BLUE SHIELD | Admitting: Family Medicine

## 2017-09-22 ENCOUNTER — Other Ambulatory Visit: Payer: Self-pay

## 2017-09-22 VITALS — BP 130/80 | HR 72 | Temp 97.6°F | Ht 67.0 in | Wt 224.2 lb

## 2017-09-22 DIAGNOSIS — M5441 Lumbago with sciatica, right side: Secondary | ICD-10-CM | POA: Diagnosis not present

## 2017-09-22 DIAGNOSIS — R3915 Urgency of urination: Secondary | ICD-10-CM | POA: Insufficient documentation

## 2017-09-22 DIAGNOSIS — G8929 Other chronic pain: Secondary | ICD-10-CM | POA: Diagnosis not present

## 2017-09-22 MED ORDER — CYCLOBENZAPRINE HCL 10 MG PO TABS: 10.0000 mg | ORAL_TABLET | Freq: Three times a day (TID) | ORAL | 2 refills | Status: DC | PRN

## 2017-09-22 NOTE — Assessment & Plan Note (Signed)
Patient is endorsing urinary urgency only. Most likely BPH but patient did not want rectal exam today. He states he has never been told he has BPH but would likely benefit from prostate exam at some point.   I did PSA today to help rule out prostate cancer as patient requested it.

## 2017-09-22 NOTE — Assessment & Plan Note (Signed)
Most likely his back pain is due to sciatica but it could be due to irritation of a previous "slipped disc" patient says he was told he had when he was in [place]. I will obtain lumbar x-ray to see if he has degenerative disc disease. Continue gabapentin and added flexeril 10mg  TID PRN. I also gave patient home stretches to perform to help with his low back pain to be done daily.  I obtained a BMP to check renal function. If normal I would have patient stop all OTC NSAIDS and I would give him a two week supply of Meloxicam for his acute episode of low back pain. I will call patient with lab results if abnormal, if normal I will send in prescription of meloxicam for patient and have him take it for two weeks.   Patient denied any red flag symptoms such as loss of sensation in the groin region, loss of bowel or bladder control.

## 2017-09-22 NOTE — Progress Notes (Signed)
Subjective: Chief Complaint  Patient presents with  . Follow-up   A translator was used for the duration of this visit.  HPI: Darren Nelson is a 62 y.o. presenting to clinic today to discuss the following:  Low Back Pain Follow Up Patient returns to clinic for follow up of his low back pain. He was told prior to his arrival in the [place] that he had a herniated disc in his back but he has had no imaging in the [place]. He was started on Gabapentin and states it helps some but he is still having pain. He states it is primarily on the right side of his low back and radiates down his right leg. Made worse with physical activity of gardening which he does for his work. He has been using his stationary bike at home.  He denies loss of sensation in the groin region and denies loss of bowel or bladder control.  Prostate Cancer screen Patient requested screening for prostate cancer due to urinary frequency. He elected to have a blood test done.  Health Maintenance: none today     ROS noted in HPI.   Past Medical, Surgical, Social, and Family History Reviewed & Updated per EMR.   Pertinent Historical Findings include:   Social History   Tobacco Use  Smoking Status Never Smoker  Smokeless Tobacco Never Used    Objective: BP 130/80   Pulse 72   Temp 97.6 F (36.4 C) (Oral)   Ht 5\' 7"  (1.702 m)   Wt 224 lb 3.2 oz (101.7 kg)   SpO2 95%   BMI 35.11 kg/m  Vitals and nursing notes reviewed  Physical Exam Gen: Alert and Oriented x 3, NAD CV: RRR, no murmurs, normal S1, S2 split Resp: CTAB, no wheezing, rales, or rhonchi, comfortable work of breathing Abd: non-distended, non-tender, soft, +bs in all four quadrants MSK: Moves all four extremities Ext: no clubbing, cyanosis, or edema Neuro: No gross deficits, sharp/dull sensation intact LE bilaterally, gait normal Skin: warm, dry, intact, no rashes  No results found for this or any previous visit (from the past 72  hour(s)).  Assessment/Plan:  Urinary urgency Patient is endorsing urinary urgency only. Most likely BPH but patient did not want rectal exam today. He states he has never been told he has BPH but would likely benefit from prostate exam at some point.   I did PSA today to help rule out prostate cancer as patient requested it.  Chronic bilateral low back pain Most likely his back pain is due to sciatica but it could be due to irritation of a previous "slipped disc" patient says he was told he had when he was in [place]. I will obtain lumbar x-ray to see if he has degenerative disc disease. Continue gabapentin and added flexeril 10mg  TID PRN. I also gave patient home stretches to perform to help with his low back pain to be done daily.  I obtained a BMP to check renal function. If normal I would have patient stop all OTC NSAIDS and I would give him a two week supply of Meloxicam for his acute episode of low back pain. I will call patient with lab results if abnormal, if normal I will send in prescription of meloxicam for patient and have him take it for two weeks.   Patient denied any red flag symptoms such as loss of sensation in the groin region, loss of bowel or bladder control.   PATIENT EDUCATION PROVIDED: See  AVS    Diagnosis and plan along with any newly prescribed medication(s) were discussed in detail with this patient today. The patient verbalized understanding and agreed with the plan. Patient advised if symptoms worsen return to clinic or ER.   Health Maintainance:   Orders Placed This Encounter  Procedures  . DG Lumbar Spine Complete    Standing Status:   Future    Standing Expiration Date:   09/23/2018    Order Specific Question:   Reason for Exam (SYMPTOM  OR DIAGNOSIS REQUIRED)    Answer:   acute low back    Order Specific Question:   Preferred imaging location?    [facility]    Order Specific Question:   Radiology Contrast Protocol - do NOT remove  file path    Answer:   [email or web address].pdf  . Basic Metabolic Panel  . PSA    Meds ordered this encounter  Medications  . cyclobenzaprine (FLEXERIL) 10 MG tablet    Sig: Take 1 tablet (10 mg total) by mouth 3 (three) times daily as needed for muscle spasms.    Dispense:  30 tablet    Refill:  2     Darren Schickim Darren Fiddler, DO 09/22/2017, 11:45 AM PGY-2 [facility]

## 2017-09-22 NOTE — Patient Instructions (Signed)
Te he enviado una receta para un medicamento llamado Flexeril. Puede tomarlo segn sea necesario hasta 3 veces al da. Tenga cuidado al usarlo, ya que puede causarle sueo.  Voy a conseguir un anlisis de sangre de usted hoy y Chief Executive Officerle informar si los resultados son anormales.  Por favor, haz los ejercicios que te he dado para ayudarte con tu dolor de espalda baja.   Por favor, asegrese de hacer la radiografa de su espalda baja hoy.   Citica (Sciatica) La citica es el dolor, la debilidad, el entumecimiento o el hormigueo a lo largo del nervio citico. El nervio citico comienza en la parte inferior de la espalda y desciende por la parte posterior de cada pierna. La citica se produce cuando este nervio se comprime o se ejerce presin sobre l. Suele desaparecer por s sola o con tratamiento. A veces, la citica puede volver a aparecer. CUIDADOS EN EL HOGAR Medicamentos  Baxter Internationalome los medicamentos de venta libre y los recetados solamente como se lo haya indicado el mdico.  No conduzca ni use maquinaria pesada mientras toma analgsicos recetados. Control del dolor  Si se lo indican, aplique hielo sobre la zona afectada. ? Ponga el hielo en una bolsa plstica. ? Coloque una FirstEnergy Corptoalla entre la piel y la bolsa de hielo. ? Coloque el hielo durante 20minutos, 2 a 3veces por da.  Despus del hielo, aplique calor sobre la zona afectada antes de hacer ejercicio o con la frecuencia que le haya indicado el mdico. Use la fuente de calor que el mdico le indique, por ejemplo, una compresa de calor hmedo o una almohadilla trmica. ? Coloque una FirstEnergy Corptoalla entre la piel y la fuente de Airline pilotcalor. ? Aplique el calor durante 20 a 30minutos. ? Retire la fuente de calor si la piel se le pone de color rojo brillante. Esto es muy importante si no puede sentir el dolor, el calor o el fro. Puede correr un riesgo mayor de sufrir quemaduras. Actividad  Retome sus actividades habituales como se lo haya indicado el mdico.  Pregntele al mdico qu actividades son seguras para usted. ? Evite las actividades que empeoran la citica.  Descanse por breves perodos Administratordurante el da. Hgalo recostado o de pie. Esto suele ser mejor que descansar sentado. ? Cuando descanse durante perodos ms largos, haga alguna actividad fsica o un estiramiento entre los perodos de descanso. ? Evite estar sentado durante largos perodos sin moverse. Levntese y Eddyvillemuvase al menos una vez cada hora.  Haga ejercicios y estrese con regularidad, como se lo indic el mdico.  No levante nada que pese ms de 10libras (4,5kg) mientras tenga sntomas de citica. ? Aunque no tenga sntomas, evite levantar objetos pesados. ? Evite levantar objetos pesados de forma repetida.  Al levantar objetos, hgalos siempre de una forma que sea segura para su cuerpo. Para esto, debe hacer lo siguiente: ? Flexione las rodillas. ? Mantenga el objeto cerca del cuerpo. ? No se tuerza. Instrucciones generales  Mantenga una buena postura. ? Evite reclinarse hacia adelante cuando est sentado. ? Evite encorvar la espalda mientras est de pie.  Mantenga un peso saludable.  Use calzado cmodo, que le d soporte al pie. Evite usar tacones.  Evite dormir sobre un colchn que sea demasiado blando o demasiado duro. Es posible que sienta menos dolor si duerme en un colchn con apoyo suficientemente firme para la espalda.  Concurra a todas las visitas de control como se lo haya indicado el mdico. Esto es importante. SOLICITE AYUDA SI:  Tiene un dolor con estas caractersticas: ? Lo despierta cuando est dormido. ? Empeora al estar recostado. ? Es Government social research officerpeor que el dolor que tena en el pasado. ? Dura ms de 4semanas.  Pierde peso sin proponrselo. SOLICITE AYUDA DE INMEDIATO SI:  No puede controlar la orina (miccin) ni la evacuacin de la materia fecal (defecacin).  Tiene debilidad en alguna de estas zonas, y la debilidad empeora. ? La parte inferior  de la espalda. ? La parte inferior del abdomen (pelvis). ? Los glteos. ? Las piernas.  Siente irritacin o inflamacin en la espalda.  Tiene sensacin de ardor al ConocoPhillipsorinar. Esta informacin no tiene Theme park managercomo fin reemplazar el consejo del mdico. Asegrese de hacerle al mdico cualquier pregunta que tenga. Document Released: 02/23/2010 Document Revised: 05/15/2015 Document Reviewed: 09/30/2014 Elsevier Interactive Patient Education  [employer]2018 Elsevier Inc.

## 2017-09-23 LAB — BASIC METABOLIC PANEL
BUN/Creatinine Ratio: 13 (ref 10–24)
BUN: 13 mg/dL (ref 8–27)
CHLORIDE: 107 mmol/L — AB (ref 96–106)
CO2: 23 mmol/L (ref 20–29)
Calcium: 9 mg/dL (ref 8.6–10.2)
Creatinine, Ser: 0.98 mg/dL (ref 0.76–1.27)
GFR, EST AFRICAN AMERICAN: 96 mL/min/{1.73_m2} (ref >59)
GFR, EST NON AFRICAN AMERICAN: 83 mL/min/{1.73_m2} (ref >59)
Glucose: 95 mg/dL (ref 65–99)
POTASSIUM: 4.5 mmol/L (ref 3.5–5.2)
SODIUM: 145 mmol/L — AB (ref 134–144)

## 2017-09-23 LAB — PSA: Prostate Specific Ag, Serum: 0.1 ng/mL (ref 0.0–4.0)

## 2017-09-29 ENCOUNTER — Ambulatory Visit
Admission: RE | Admit: 2017-09-29 | Discharge: 2017-09-29 | Disposition: A | Discharge status: Home / Self Care | Payer: BLUE CROSS/BLUE SHIELD | Source: Ambulatory Visit | Attending: Family Medicine | Admitting: Family Medicine

## 2017-09-29 DIAGNOSIS — M47816 Spondylosis without myelopathy or radiculopathy, lumbar region: Secondary | ICD-10-CM | POA: Diagnosis not present

## 2017-09-29 DIAGNOSIS — G8929 Other chronic pain: Secondary | ICD-10-CM

## 2017-09-29 DIAGNOSIS — M5441 Lumbago with sciatica, right side: Principal | ICD-10-CM

## 2017-10-20 ENCOUNTER — Ambulatory Visit: Payer: BLUE CROSS/BLUE SHIELD | Admitting: Family Medicine

## 2017-10-20 ENCOUNTER — Other Ambulatory Visit: Payer: Self-pay

## 2017-10-20 ENCOUNTER — Encounter: Payer: Self-pay | Admitting: Family Medicine

## 2017-10-20 VITALS — BP 124/82 | HR 73 | Temp 98.1°F | Ht 67.0 in | Wt 224.0 lb

## 2017-10-20 DIAGNOSIS — N401 Enlarged prostate with lower urinary tract symptoms: Secondary | ICD-10-CM

## 2017-10-20 DIAGNOSIS — K625 Hemorrhage of anus and rectum: Secondary | ICD-10-CM

## 2017-10-20 DIAGNOSIS — Z1211 Encounter for screening for malignant neoplasm of colon: Secondary | ICD-10-CM | POA: Diagnosis not present

## 2017-10-20 DIAGNOSIS — R338 Other retention of urine: Secondary | ICD-10-CM

## 2017-10-20 DIAGNOSIS — Z23 Encounter for immunization: Secondary | ICD-10-CM | POA: Diagnosis not present

## 2017-10-20 MED ORDER — TAMSULOSIN HCL 0.4 MG PO CAPS: 0.4000 mg | ORAL_CAPSULE | Freq: Every day | ORAL | 1 refills | Status: DC

## 2017-10-20 NOTE — Progress Notes (Signed)
Subjective:  Darren Nelson is a 62 y.o. male who presents to the [facility] today for a follow up visit for lab results and low back pain.   Interpreter is used to complete exam.   HPI:  Patient reports he is pleased that his lab results were normal and that is x-ray did not show any severe issues with his back.   In the discussion of his results the paitent voices concerns of seeing blood in his stools for the past few days. He describes it as bright red blood in the toilet after a BM as well and on the toilet paper when he wipes. He says that his BMs were hard on consistency and sometimes painful but over the past few days he has eaten more fruits and they have softened up. He says that he has seen blood in his stools intermittently over the past few months but it has gone away on its own. Patient denies any history of hemorrhoids, cancer, bowel issues. He says that he has never had a colonoscopy. Patient reports his brother has or had cancer but he does not know what kind it was. He says his brother had to have his leg removed because of it.   Darren Nelson is also complaining of a weak urine stream. He says that he has no issues initiating urinating but that he feels he is urinating a small volume. He denies any hematuria, dysuria. Does report issues of impotence as well.     Patient denies fever, chills, weight loss, NVD. Denies smoking, EtOH.   Objective:  Physical Exam: BP 124/82   Pulse 73   Temp 98.1 F (36.7 C) (Oral)   Ht 5\' 7"  (1.702 m)   Wt 224 lb (101.6 kg)   SpO2 99%   BMI 35.08 kg/m   Gen: NAD, resting comfortably CV: RRR with no murmurs appreciated Pulm: NWOB, CTAB with no crackles, wheezes, or rhonchi GI: Normal bowel sounds present. Soft, Nontender, Nondistended. MSK: no edema, cyanosis, or clubbing noted Skin: warm, dry Neuro: grossly normal, moves all extremities Psych: Normal affect and thought content   Assessment/Plan:  Darren Nelson is a 62  y.o. male who presents to the [facility] today for a follow up visit for lab results and low back pain. He is also complaining of bright red blood in stool and well as difficulty with urinating.   Bright red blood per rectum  -- likely hemorrhoidal bleeding, however given patient's age and lack of previous colonoscopy or other colon cancer screening, a colonoscopy is indicated -- Referral sent to GI to schedule colonoscopy   Urinary symptoms  -- Likely related to BPH given lack of infectious symptoms and patient's age and urinary retention -- Patient recent abdominal x-ray showed non-obstructing stones in left renal pelvis and ureter, which should not cause his urinary difficulties -- will start Flomax 0.4 mg daily once daily; may need to increase to BID if no response in one month -- Follow up in one month   Charna Elizabeth, Ms4 [employer]  10/21/2017 9:03 AM  RESIDENT ADDENDUM I have separately seen and examined the patient. I have discussed the findings and exam with the medical student and agree with the above note. I helped develop the management plan that is described in the student's note, and I agree with the content.  I have edited the medical student's note to reflect my assessment and plan.  Amanda C. Frances Furbish, MD PGY-2, [facility] 10/21/2017 9:03 AM

## 2017-10-20 NOTE — Patient Instructions (Signed)
Por favor empieza Flomax para su orina.  Toma una pastilla cada dia.    Por favor regresa en una mes para chequear los resultados de Principal Financialesto medicamento.  Alguien va a llamarle de la oficina del gastroenterologo para hacer una cita.

## 2017-10-21 DIAGNOSIS — K625 Hemorrhage of anus and rectum: Secondary | ICD-10-CM | POA: Insufficient documentation

## 2017-10-21 DIAGNOSIS — N400 Enlarged prostate without lower urinary tract symptoms: Secondary | ICD-10-CM | POA: Insufficient documentation

## 2017-10-21 NOTE — Assessment & Plan Note (Signed)
--   likely hemorrhoidal bleeding, however given patient's age and lack of previous colonoscopy or other colon cancer screening, a colonoscopy is indicated -- Referral sent to GI to schedule colonoscopy

## 2017-10-21 NOTE — Assessment & Plan Note (Signed)
--   Likely related to BPH given lack of infectious symptoms and patient's age and urinary retention -- Patient recent abdominal x-ray showed non-obstructing stones in left renal pelvis and ureter, which should not cause his urinary difficulties -- will start Flomax 0.4 mg daily once daily; may need to increase to BID if no response in one month -- Follow up in one month

## 2017-12-02 ENCOUNTER — Encounter: Payer: Self-pay | Admitting: Family Medicine

## 2017-12-19 ENCOUNTER — Ambulatory Visit: Payer: BLUE CROSS/BLUE SHIELD | Admitting: Family Medicine

## 2017-12-19 ENCOUNTER — Other Ambulatory Visit: Payer: Self-pay

## 2017-12-19 ENCOUNTER — Encounter: Payer: Self-pay | Admitting: Family Medicine

## 2017-12-19 VITALS — BP 124/84 | HR 64 | Temp 98.5°F | Ht 67.0 in | Wt 221.0 lb

## 2017-12-19 DIAGNOSIS — K625 Hemorrhage of anus and rectum: Secondary | ICD-10-CM

## 2017-12-19 DIAGNOSIS — G8929 Other chronic pain: Secondary | ICD-10-CM

## 2017-12-19 DIAGNOSIS — N401 Enlarged prostate with lower urinary tract symptoms: Secondary | ICD-10-CM | POA: Diagnosis not present

## 2017-12-19 DIAGNOSIS — M5441 Lumbago with sciatica, right side: Secondary | ICD-10-CM | POA: Diagnosis not present

## 2017-12-19 DIAGNOSIS — R338 Other retention of urine: Secondary | ICD-10-CM

## 2017-12-19 MED ORDER — IBUPROFEN 600 MG PO TABS: 600.0000 mg | ORAL_TABLET | Freq: Three times a day (TID) | ORAL | 3 refills | Status: DC

## 2017-12-19 MED ORDER — TAMSULOSIN HCL 0.4 MG PO CAPS: 0.4000 mg | ORAL_CAPSULE | Freq: Every day | ORAL | 1 refills | Status: AC

## 2017-12-19 MED ORDER — OMEPRAZOLE 20 MG PO CPDR: 20.0000 mg | DELAYED_RELEASE_CAPSULE | Freq: Every day | ORAL | 3 refills | Status: DC

## 2017-12-19 MED ORDER — CYCLOBENZAPRINE HCL 10 MG PO TABS: 10.0000 mg | ORAL_TABLET | Freq: Three times a day (TID) | ORAL | 2 refills | Status: DC | PRN

## 2017-12-19 MED ORDER — GABAPENTIN 300 MG PO CAPS: 300.0000 mg | ORAL_CAPSULE | Freq: Two times a day (BID) | ORAL | 3 refills | Status: DC

## 2017-12-19 NOTE — Progress Notes (Signed)
[facility] Phone: [phone]   cc: back pain  Subjective:  Patient states he ran out of his medication that was prescribed during his last visit.  The medication he was prescribed during his last visit was flomax. The pain medication makes the pain a little better but does go away completely.  Has a little trouble urinating but this has been the same since last visit and was not worsened after he ran out of medication.  He points to his lumbar region as the location of his pain.  Having numbness in his right leg that coincided with the back pain for 3 months.  He denies urinary incontinence or impotence, but is endorsing some saddle anesthesia. Patient could not say which medicine needed to be refilled other than that it was the one from last visit.    Has pain with bowel movement for the past 8 months. Many times he has seen bright red blood in his bowel movement.  He was supposed to get a colonoscopy after his last appointment but they could not reach him by phone. He is leaving the country in December for two months at least. He hasn't had blood in stool this week.   ROS: See HPI for pertinent positives and negatives  Past Medical History  Family history reviewed for today's visit. No changes.  Social history- patient is a non smoker. 25 years ago quit.    Objective: BP 124/84   Pulse 64   Temp 98.5 F (36.9 C) (Oral)   Ht 5\' 7"  (1.702 m)   Wt 221 lb (100.2 kg)   SpO2 98%   BMI 34.61 kg/m  Gen: NAD, alert and oriented, cooperative with exam HEENT: NCAT, EOMI, MMM CV: normal rate, regular rhythm. No murmurs, no rubs.  Resp: LCTAB, no wheezes, crackles. normal work of breathing GI: nontender to palpation, BS present, no guarding or organomegaly Msk: TTP along the spinous processes near L5.  No malformations palpated.  Pain with spinal flexion and extension.  Straight legged test positive for right leg.   Skin: No rashes, no lesions Psych: Appropriate  behavior  Assessment/Plan: Chronic bilateral low back pain Patient complains of pre-existing lower back pain and stating he needs a refill on the medication he was prescribed during his last visit.  This medication was flomax for his BPH.  When asked repeatedly which medicine it was patient stated he did not know.  He is also currently on flexeril (prescribed in august) and gabapentin (prescribed in June).  Eventually he stated that he took the medicine twice a day, and gabapentin is the only medicine prescribed twice a day.  Given that we were unable to determine which medicine patient was in need of, and given that he was leaving the country in two weeks, it was decided to represcribe all three medications along with ibuprofen so that he would have them during his trip.  Patient is to reschedule an appointment upon returning to the [place].   Bright red blood per rectum Patient still having BRBPR but has not had it this week.  Patient did not respond to calls from the GI specialist for a colonoscopy.  Given that he is leaving in two weeks we would be unable to schedule an appointment before that time, so he was given the number of the GI specialist and told to reschedule an appointment after he returns.    BPH (benign prostatic hyperplasia) Patient states he still has decreased urine output.  Unable  to determine if he needs refill currently as patient has insufficient knowledge regarding which medications he is taking and for which purpose.  Re-prescribed his flomax.  Will followup when he returns to country.     Frederic Jericho, MD PGY-1

## 2017-12-19 NOTE — Patient Instructions (Addendum)
Llame para programar una cita en [facility] cuando regrese de su viaje en Febrero. Aqu est su nmero [phone].  I am continuing your flomax medicine.  For the back pain I would also start taking ibuprofen and tylenol for the pain. You can get this at the grocery store or pharmacy.  Putting a heating blanket on the part of your back that hurts can also help with the pain.  The pain should go away by the time you get back from your trip but if it hasn't come back to the clinic.   Tylenol - you can take 650mg  every 8 hours Ibuprofen or Advil - you can take 600 mg every 6 hours.

## 2017-12-20 NOTE — Assessment & Plan Note (Signed)
Patient states he still has decreased urine output.  Unable to determine if he needs refill currently as patient has insufficient knowledge regarding which medications he is taking and for which purpose.  Re-prescribed his flomax.  Will followup when he returns to country.

## 2017-12-20 NOTE — Assessment & Plan Note (Signed)
Patient complains of pre-existing lower back pain and stating he needs a refill on the medication he was prescribed during his last visit.  This medication was flomax for his BPH.  When asked repeatedly which medicine it was patient stated he did not know.  He is also currently on flexeril (prescribed in august) and gabapentin (prescribed in June).  Eventually he stated that he took the medicine twice a day, and gabapentin is the only medicine prescribed twice a day.  Given that we were unable to determine which medicine patient was in need of, and given that he was leaving the country in two weeks, it was decided to represcribe all three medications along with ibuprofen so that he would have them during his trip.  Patient is to reschedule an appointment upon returning to the [place].

## 2017-12-20 NOTE — Assessment & Plan Note (Signed)
Patient still having BRBPR but has not had it this week.  Patient did not respond to calls from the GI specialist for a colonoscopy.  Given that he is leaving in two weeks we would be unable to schedule an appointment before that time, so he was given the number of the GI specialist and told to reschedule an appointment after he returns.

## 2019-09-29 IMAGING — CR DG LUMBAR SPINE COMPLETE 4+V
5 series · 5 of 5 positions shown · non-contrast
Comparison: None.

CLINICAL DATA: Low back pain following heavy lifting

EXAM:
LUMBAR SPINE - COMPLETE 4+ VIEW

[t l-spine a.p.]
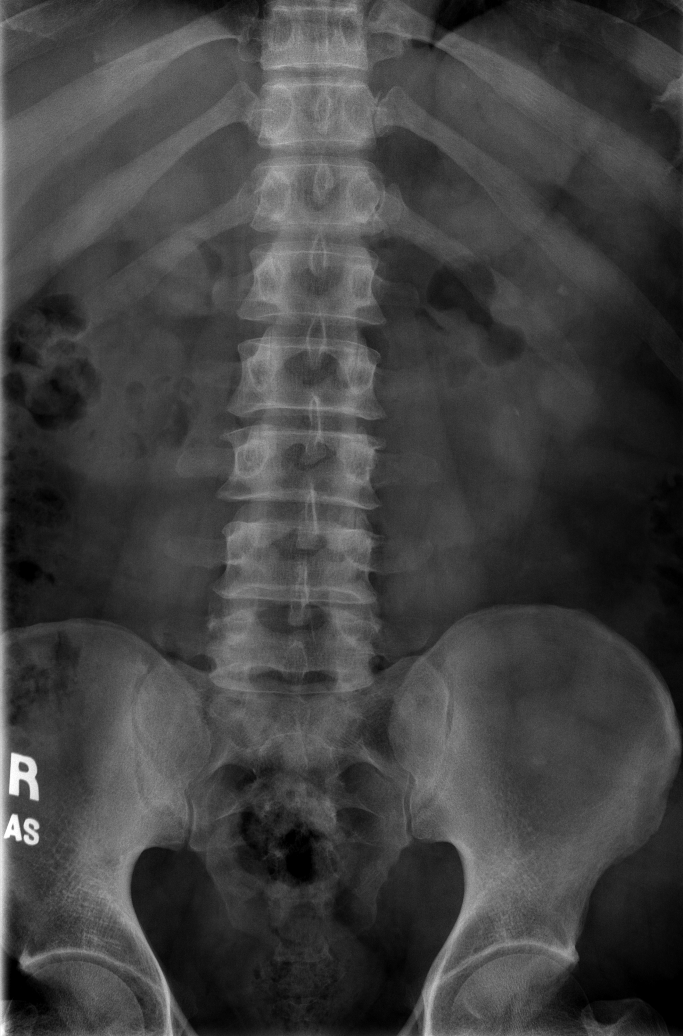

[t l-spine oblique exposure * (1 of 2)]
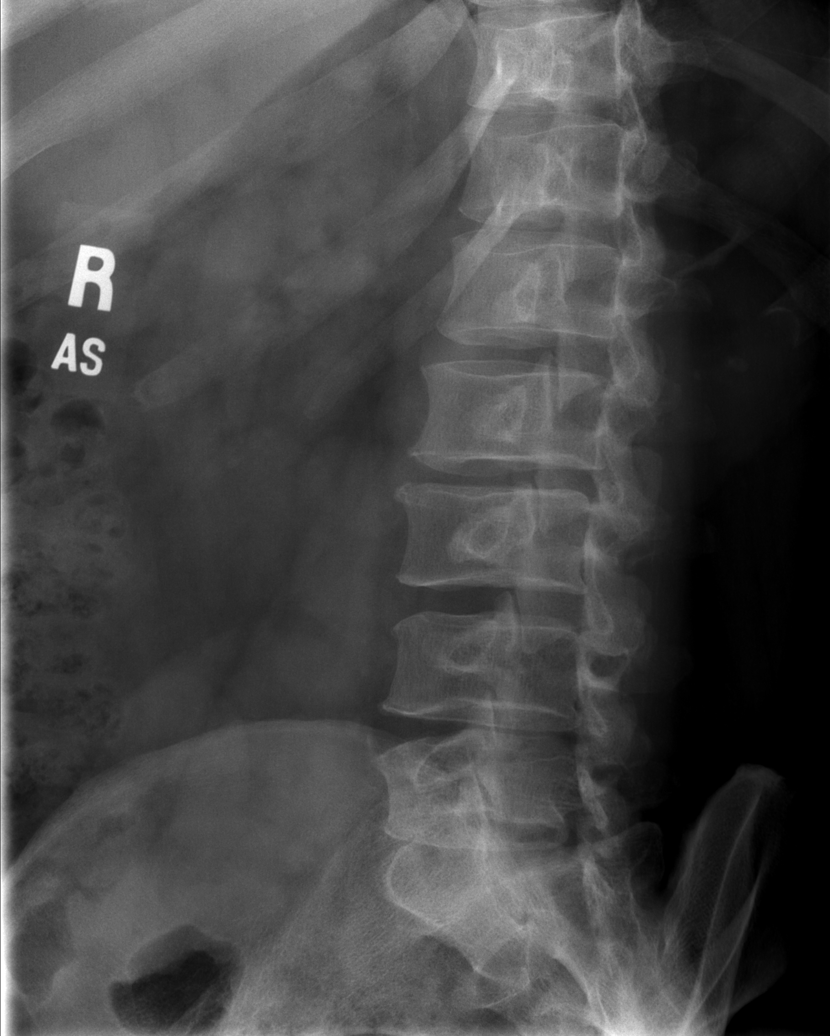

[t l-spine oblique exposure * (2 of 2)]
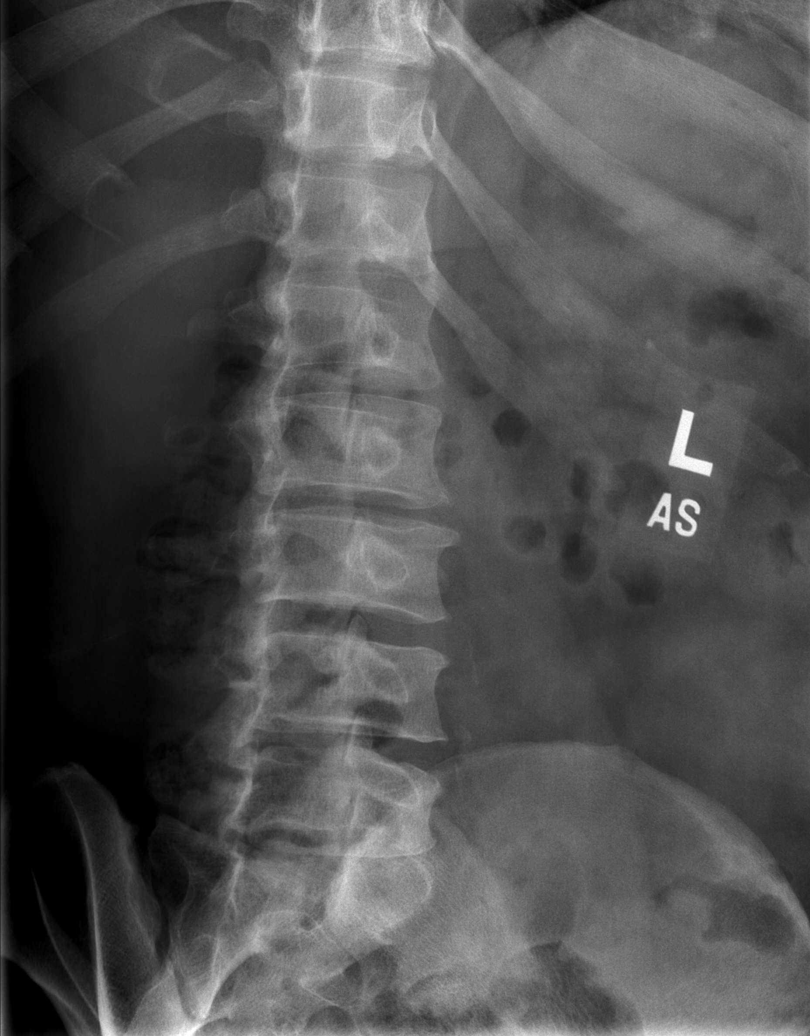

[t l-spine lat *]
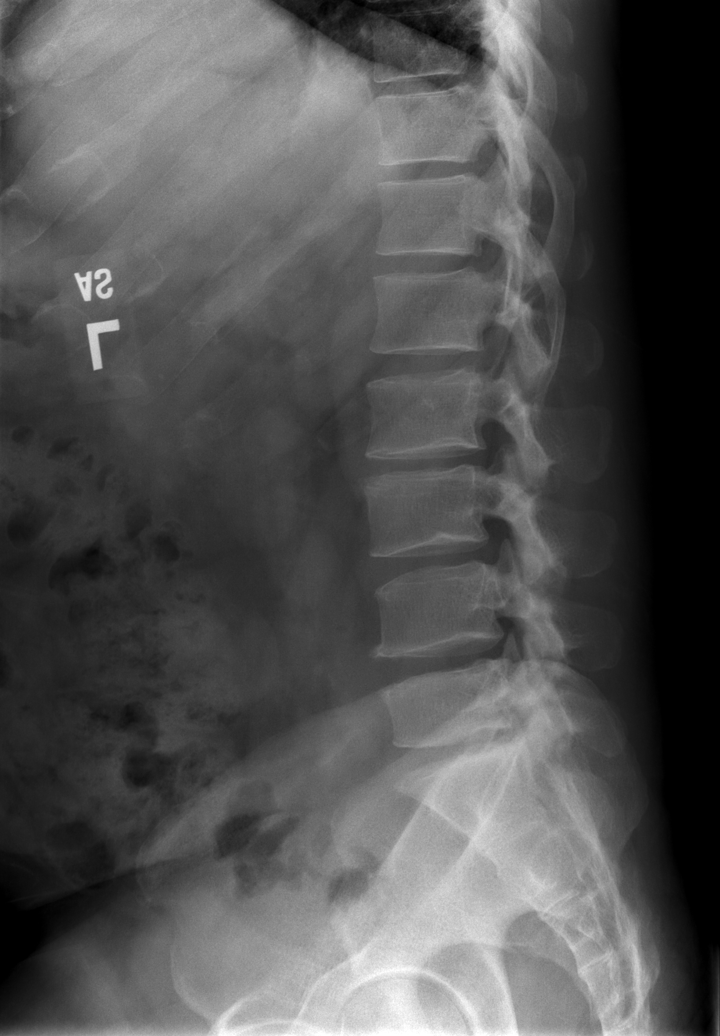

[t l-spine l5-s1 spot]
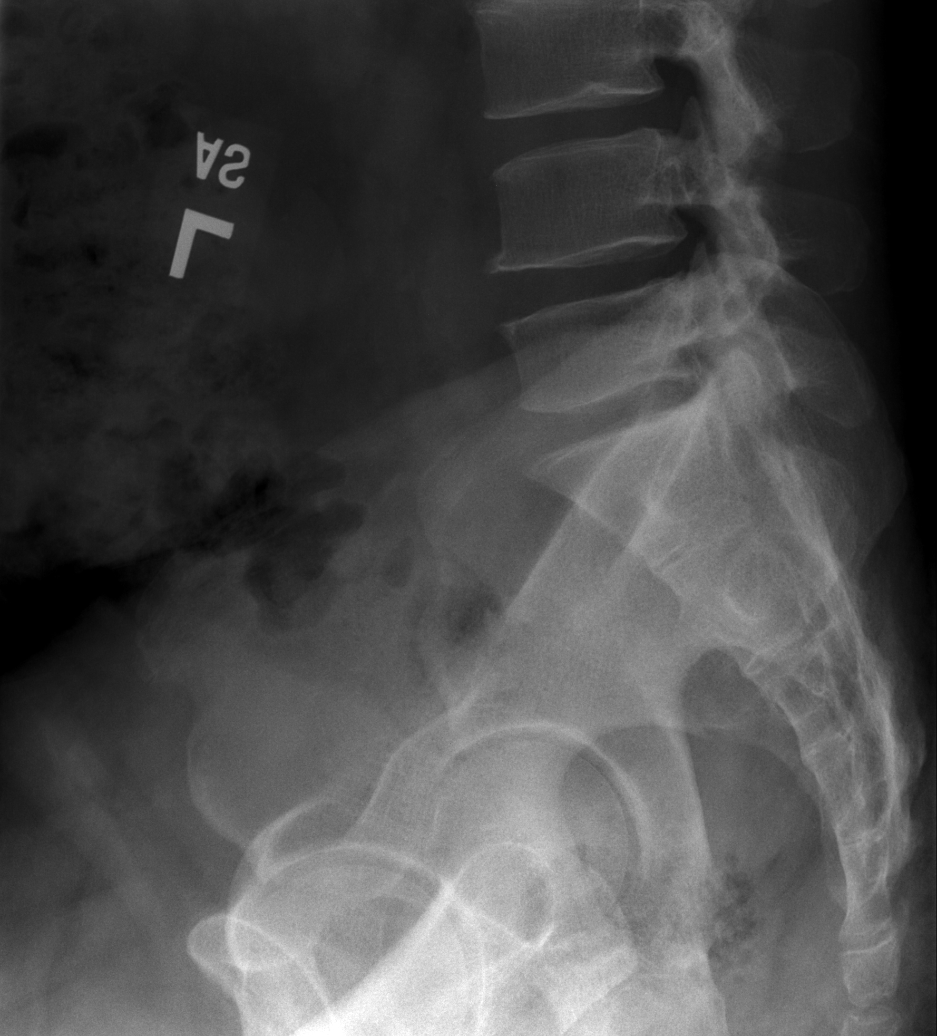

[5 of 5 positions shown; findings below may reference images not displayed]

FINDINGS: Five lumbar type vertebral bodies are well visualized. Vertebral
body height is well maintained. No pars defects are seen. No
anterolisthesis is noted. Mild osteophytic changes are seen. Two
calcifications are noted to the left of the midline suspicious for
nonobstructing renal stones.
IMPRESSION: Mild degenerative changes of lumbar spine.

Calcifications over the left renal outline consistent with
nonobstructing stones.

## 2020-12-06 ENCOUNTER — Ambulatory Visit (INDEPENDENT_AMBULATORY_CARE_PROVIDER_SITE_OTHER): Payer: 59 | Admitting: Family Medicine

## 2020-12-06 ENCOUNTER — Other Ambulatory Visit: Payer: Self-pay

## 2020-12-06 ENCOUNTER — Encounter: Payer: Self-pay | Admitting: Family Medicine

## 2020-12-06 VITALS — BP 152/98 | HR 69 | Wt 230.2 lb

## 2020-12-06 DIAGNOSIS — M5416 Radiculopathy, lumbar region: Secondary | ICD-10-CM

## 2020-12-06 DIAGNOSIS — M545 Low back pain, unspecified: Secondary | ICD-10-CM

## 2020-12-06 DIAGNOSIS — G8929 Other chronic pain: Secondary | ICD-10-CM | POA: Diagnosis not present

## 2020-12-06 DIAGNOSIS — Z23 Encounter for immunization: Secondary | ICD-10-CM

## 2020-12-06 NOTE — Progress Notes (Signed)
    SUBJECTIVE:   CHIEF COMPLAINT / HPI:   Back Pain Patient complains of low back pain, L > R. This is chronic and has been present for many years. May be slightly worse recently. The pain started going down the back of his left leg 3 months ago. He reports the pain is constant but worse with walking. Has tried an ointment similar to [facility] which provides temporary relief. No known injury, fall or trauma. No urinary symptoms or weakness/numbness. Pain does not wake him from sleep.   PERTINENT  PMH / PSH: GERD, BPH, kidney stones  OBJECTIVE:   BP (!) 152/98   Pulse 69   Wt 230 lb 3.2 oz (104.4 kg)   SpO2 99%   BMI 36.05 kg/m   General: NAD, pleasant, able to participate in exam Respiratory: No respiratory distress Skin: warm and dry, no rashes noted Psych: Normal affect and mood Neuro: grossly intact, normal gait Back: no midline tenderness, no paraspinal muscle tenderness, negative straight leg raise, normal ROM with flexion/extension/rotation  ASSESSMENT/PLAN:   Chronic bilateral low back pain Chronic for at least 3 years. No red flag symptoms. Pain is bothersome but does not interfere with function. Exam is overall unrevealing today. Plain films in 2019 showed mild degenerative changes in lumbar region. Etiology may be osteoarthritis vs radiculopathy. Regardless, management is conservative. -Acetaminophen for pain control -Referral to physical therapy -Consider Gabapentin if persistent radicular symptoms (per chart review, he was previously on this in 2019)  Patient due for several health maintenance/screening items. Recommended scheduling separate appointment.  Spanish interpreter used for duration of encounter.   Maury Dus, MD [facility]

## 2020-12-06 NOTE — Patient Instructions (Addendum)
It was great to see you!  Things we discussed at today's visit: - Continue using the ointment to help with your back/leg pain - Take Tylenol 650mg  every 6 hours for the next week to help with the pain - I have placed a referral to physical therapy. They will call you to schedule an appointment.  -Please return in approximately 1 month to discuss preventative healthcare items such as colonoscopy (colon cancer screening), etc.  Take care and seek immediate care sooner if you develop any concerns.  Dr. Family Medicine

## 2020-12-08 NOTE — Assessment & Plan Note (Addendum)
Chronic for at least 3 years. No red flag symptoms. Pain is bothersome but does not interfere with function. Exam is overall unrevealing today. Plain films in 2019 showed mild degenerative changes in lumbar region. Etiology may be osteoarthritis vs radiculopathy. Regardless, management is conservative. -Acetaminophen for pain control -Referral to physical therapy -Consider Gabapentin if persistent radicular symptoms (per chart review, he was previously on this in 2019)

## 2021-06-04 ENCOUNTER — Ambulatory Visit
Admission: RE | Admit: 2021-06-04 | Discharge: 2021-06-04 | Disposition: A | Discharge status: Home / Self Care | Payer: No Typology Code available for payment source | Source: Ambulatory Visit | Attending: Obstetrics and Gynecology | Admitting: Obstetrics and Gynecology

## 2021-06-04 ENCOUNTER — Other Ambulatory Visit: Payer: Self-pay | Admitting: Obstetrics and Gynecology

## 2021-06-04 DIAGNOSIS — Z111 Encounter for screening for respiratory tuberculosis: Secondary | ICD-10-CM

## 2022-01-14 NOTE — Progress Notes (Unsigned)
  SUBJECTIVE:   CHIEF COMPLAINT / HPI:   Low back pain:  Of note, he was seen for chronic bilateral low back pain on 12/06/2020 which is suspected to be OA versus radiculopathy and treated with conservative management.  At that time patient was advised to use Tylenol and referral placed to physical therapy.  PERTINENT  PMH / PSH: GERD, BPH, kidney stones, chronic bilateral low back pain  Past Medical History:  Diagnosis Date   BPH (benign prostatic hyperplasia)    Kidney stones     OBJECTIVE:  There were no vitals taken for this visit. Physical Exam   ASSESSMENT/PLAN:  There are no diagnoses linked to this encounter. No follow-ups on file. [name], DO 01/14/2022, 3:26 PM PGY-***, [facility] {    This will disappear when note is signed, click to select method of visit    :1}

## 2022-01-15 ENCOUNTER — Encounter: Payer: Self-pay | Admitting: Student

## 2022-01-15 ENCOUNTER — Ambulatory Visit (INDEPENDENT_AMBULATORY_CARE_PROVIDER_SITE_OTHER): Payer: Self-pay | Admitting: Student

## 2022-01-15 VITALS — BP 130/70 | HR 71 | Wt 230.6 lb

## 2022-01-15 DIAGNOSIS — K625 Hemorrhage of anus and rectum: Secondary | ICD-10-CM

## 2022-01-15 DIAGNOSIS — G8929 Other chronic pain: Secondary | ICD-10-CM

## 2022-01-15 DIAGNOSIS — M545 Low back pain, unspecified: Secondary | ICD-10-CM

## 2022-01-15 MED ORDER — MELOXICAM 15 MG PO TABS: 15.0000 mg | ORAL_TABLET | Freq: Every day | ORAL | 0 refills | Status: DC

## 2022-01-15 NOTE — Assessment & Plan Note (Signed)
According to patient, colonoscopy 1 year ago was normal.  Opted to return at a later date to further discuss.

## 2022-01-15 NOTE — Patient Instructions (Signed)
It was great to see you today! Thank you for choosing [facility] for your primary care. Darren Nelson was seen for chronic low back pain.  Today we addressed: I am prescribing you Mobic 15 mg to take daily x 7 days.  This should give you some pain relief.  I also recommend you go to physical therapy.  Follow-up in 6 weeks for this to see how your physical therapy is doing. Given you are having some rectal bleeding, this should be further discussed at another visit.  I recommend coming back in a couple weeks to discuss this and further healthcare maintenance.  If you haven't already, sign up for My Chart to have easy access to your labs results, and communication with your primary care physician.  I recommend that you always bring your medications to each appointment as this makes it easy to ensure you are on the correct medications and helps [place] not miss refills when you need them. Call the clinic at [phone] if your symptoms worsen or you have any concerns.  Please arrive 15 minutes before your appointment to ensure smooth check in process.  We appreciate your efforts in making this happen.  Thank you for allowing me to participate in your care, [name], DO 01/15/2022, 2:42 PM PGY-2, [facility]

## 2022-01-15 NOTE — Assessment & Plan Note (Signed)
Physical exam and history consistent with MSK pain, unlikely to be spinal stenosis with preference of back extension.  No history of trauma therefore do not feel that lumbar imaging would benefit.  Recommend conservative management and physical therapy.  Advise returning in 6 weeks for discussion of PT benefit although patient stated he would be out of country and will return when he is able likely around February 2024.

## 2022-06-11 ENCOUNTER — Ambulatory Visit (INDEPENDENT_AMBULATORY_CARE_PROVIDER_SITE_OTHER): Payer: Self-pay | Admitting: Family Medicine

## 2022-06-11 VITALS — BP 125/81 | HR 81 | Wt 230.0 lb

## 2022-06-11 DIAGNOSIS — M545 Low back pain, unspecified: Secondary | ICD-10-CM

## 2022-06-11 DIAGNOSIS — E669 Obesity, unspecified: Secondary | ICD-10-CM

## 2022-06-11 MED ORDER — MELOXICAM 15 MG PO TABS: 15.0000 mg | ORAL_TABLET | Freq: Every day | ORAL | 0 refills | Status: DC

## 2022-06-11 MED ORDER — BACLOFEN 5 MG PO TABS: 5.0000 mg | ORAL_TABLET | Freq: Three times a day (TID) | ORAL | 0 refills | Status: DC | PRN

## 2022-06-11 NOTE — Progress Notes (Unsigned)
    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 Pain is constant. No particular aggravating factors although notices it more at night. Took a dose of diclofenac yesterday but otherwise hasn't tried anything for relief. No urinary issues, no fever or weight loss, no numbness or tingling in legs  Brings copy of lumbar x-rays he had done in [place] on 03/22/22. See media tab. Will request to have these translated.  PERTINENT  PMH / PSH: obesity, h/o kidney stones  OBJECTIVE:   BP 125/81   Pulse 81   Wt 230 lb (104.3 kg)   SpO2 99%   BMI 36.02 kg/m   General: NAD, pleasant, able to participate in exam Respiratory: No respiratory distress Skin: warm and dry, no rashes noted Psych: Normal affect and mood Neuro: grossly intact Back: No obvious deformity. No midline bony tenderness. Diffuse tenderness to palpation throughout lumbar region. Moderately decreased ROM with flexion, extension, rotation, and lateral bending secondary to pain. Negative SLR bilaterally.  Strength and sensation to light touch intact in lower extremities.  ASSESSMENT/PLAN:   Low back pain Acute x1 week after heavy lifting at work. No red flags. Presentation consistent with lumbar strain. Has known degenerative disease in his lumbar region which could be flaring as well.  -Meloxicam 15mg  daily x10 days (will check BMP for renal function today) -Baclofen 5mg  TID prn -Referral to physical therapy -Avoid heavy lifting/repetitive bending for now -Return if no improvement     Maury Dus, MD [facility]

## 2022-06-11 NOTE — Patient Instructions (Addendum)
It was great to see you!  We are checking your kidney numbers today. I will send you a letter with the results or call if needed.  For your back, I sent 2 medications to your pharmacy: -Meloxicam: take 1 tablet daily for 10 days -Baclofen: take 1 tablet up to three times daily as needed for muscle spasms  Apply ice or heat to your back, whichever provides more relief.  I sent a referral to physical therapy. They will contact you to schedule an appointment.  NO heavy lifting until your symptoms are better.   Take care, Dr Anner Crete

## 2022-06-12 ENCOUNTER — Encounter: Payer: Self-pay | Admitting: Family Medicine

## 2022-06-12 LAB — BASIC METABOLIC PANEL
BUN/Creatinine Ratio: 15 (ref 10–24)
BUN: 16 mg/dL (ref 8–27)
CO2: 25 mmol/L (ref 20–29)
Calcium: 9.3 mg/dL (ref 8.6–10.2)
Chloride: 103 mmol/L (ref 96–106)
Creatinine, Ser: 1.04 mg/dL (ref 0.76–1.27)
Glucose: 118 mg/dL — ABNORMAL HIGH (ref 70–99)
Potassium: 4.8 mmol/L (ref 3.5–5.2)
Sodium: 144 mmol/L (ref 134–144)
eGFR: 80 mL/min/{1.73_m2} (ref >59)

## 2022-06-12 NOTE — Assessment & Plan Note (Signed)
Acute x1 week after heavy lifting at work. No red flags. Presentation consistent with lumbar strain. Has known degenerative disease in his lumbar region which could be flaring as well.  -Meloxicam 15mg  daily x10 days (will check BMP for renal function today) -Baclofen 5mg  TID prn -Referral to physical therapy -Avoid heavy lifting/repetitive bending for now -Return if no improvement

## 2023-07-15 ENCOUNTER — Encounter: Payer: Self-pay | Admitting: *Deleted

## 2023-10-01 ENCOUNTER — Ambulatory Visit (INDEPENDENT_AMBULATORY_CARE_PROVIDER_SITE_OTHER): Payer: Self-pay | Admitting: Family Medicine

## 2023-10-01 ENCOUNTER — Emergency Department (HOSPITAL_COMMUNITY): Payer: Self-pay

## 2023-10-01 ENCOUNTER — Encounter (HOSPITAL_COMMUNITY): Payer: Self-pay

## 2023-10-01 ENCOUNTER — Emergency Department (HOSPITAL_COMMUNITY)
Admission: EM | Admit: 2023-10-01 | Discharge: 2023-10-01 | Disposition: A | Discharge status: Home / Self Care | Payer: Self-pay | Source: Ambulatory Visit

## 2023-10-01 VITALS — BP 179/99 | HR 70 | Ht 67.0 in | Wt 231.0 lb

## 2023-10-01 DIAGNOSIS — R42 Dizziness and giddiness: Secondary | ICD-10-CM | POA: Insufficient documentation

## 2023-10-01 DIAGNOSIS — R519 Headache, unspecified: Secondary | ICD-10-CM

## 2023-10-01 DIAGNOSIS — H538 Other visual disturbances: Secondary | ICD-10-CM

## 2023-10-01 DIAGNOSIS — I10 Essential (primary) hypertension: Secondary | ICD-10-CM

## 2023-10-01 LAB — CBC
HCT: 43.5 % (ref 39.0–52.0)
Hemoglobin: 14.3 g/dL (ref 13.0–17.0)
MCH: 32 pg (ref 26.0–34.0)
MCHC: 32.9 g/dL (ref 30.0–36.0)
MCV: 97.3 fL (ref 80.0–100.0)
Platelets: 174 K/uL (ref 150–400)
RBC: 4.47 MIL/uL (ref 4.22–5.81)
RDW: 12.2 % (ref 11.5–15.5)
WBC: 6.5 K/uL (ref 4.0–10.5)
nRBC: 0 % (ref 0.0–0.2)

## 2023-10-01 LAB — BASIC METABOLIC PANEL WITH GFR
Anion gap: 8 (ref 5–15)
BUN: 18 mg/dL (ref 8–23)
CO2: 23 mmol/L (ref 22–32)
Calcium: 8.6 mg/dL — ABNORMAL LOW (ref 8.9–10.3)
Chloride: 107 mmol/L (ref 98–111)
Creatinine, Ser: 1.05 mg/dL (ref 0.61–1.24)
GFR, Estimated: >60 mL/min (ref >60)
Glucose, Bld: 107 mg/dL — ABNORMAL HIGH (ref 70–99)
Potassium: 4.2 mmol/L (ref 3.5–5.1)
Sodium: 138 mmol/L (ref 135–145)

## 2023-10-01 LAB — TROPONIN I (HIGH SENSITIVITY): Troponin I (High Sensitivity): 5 ng/L (ref <18)

## 2023-10-01 MED ORDER — AMLODIPINE BESYLATE 5 MG PO TABS: 5.0000 mg | ORAL_TABLET | Freq: Every day | ORAL | 0 refills | Status: DC

## 2023-10-01 MED ORDER — ACETAMINOPHEN 500 MG PO TABS
1000.0000 mg | ORAL_TABLET | Freq: Once | ORAL | Status: DC
  Filled 2023-10-01: qty 2

## 2023-10-01 MED ORDER — MECLIZINE HCL 25 MG PO TABS: 25.0000 mg | ORAL_TABLET | Freq: Three times a day (TID) | ORAL | 0 refills | Status: AC | PRN

## 2023-10-01 MED ORDER — MECLIZINE HCL 25 MG PO TABS
25.0000 mg | ORAL_TABLET | Freq: Once | ORAL | Status: AC
  Administered 2023-10-01: 25 mg via ORAL
  Filled 2023-10-01: qty 1

## 2023-10-01 MED ORDER — KETOROLAC TROMETHAMINE 15 MG/ML IJ SOLN
15.0000 mg | Freq: Once | INTRAMUSCULAR | Status: DC
  Filled 2023-10-01: qty 1

## 2023-10-01 MED ORDER — PROCHLORPERAZINE EDISYLATE 10 MG/2ML IJ SOLN
10.0000 mg | Freq: Once | INTRAMUSCULAR | Status: DC
  Filled 2023-10-01: qty 2

## 2023-10-01 NOTE — ED Triage Notes (Addendum)
 Pt BIB [facility] from doctors office with c/o headache that's been ongoing for the past month, progressively getting worse, meds not helping. Also reports dizziness when her turns to his left side and weakness that started about 3 weeks ago. Doctors office sent him here. No hx of high bp.  BP 192/116 HR 70 CBG 117 98% room air

## 2023-10-01 NOTE — ED Notes (Signed)
 Patient transported to MRI

## 2023-10-01 NOTE — Discharge Instructions (Addendum)
 Return if you have fevers, chills, severe headache, vision loss, facial droop, difficulty finding words, seizures, chest pain, shortness of breath, unable to walk in a straight line due to balance problems or he develop any new or worsening symptoms that are concerning to you.  Thank you for the opportunity to take care of you in our Emergency Department. You have been diagnosed with high blood pressure, also known as hypertension. This means that the force of blood against the walls of your blood vessels called is too strong. It also means that your heart has to work harder to move the blood. High blood pressure usually has no symptoms, but over time, it can cause serious health problems such as Heart attack and heart failure Stroke Kidney disease and failure Vision loss With the help from your healthcare provider and some important life style changes, you can manage your blood pressure and protect your health. Please read the instructions provided on hypertension, how to manage it and how to check your blood pressure. Additionally, use the blood pressure log provided to record your blood pressures. Take the blood pressure log with you to your primary care doctor so that they can adjust your blood pressure medications if needed. Please read the instructions on follow-up appointment. Return to the ER or Call 911 right away if you have any of these symptoms: Chest pain or shortness of breath Severe headache Weakness, tingling, or numbness of your face, arms, or legs (especially on 1 side of the body) Sudden change in vision Confusion, trouble speaking, or trouble understanding speech

## 2023-10-01 NOTE — ED Provider Notes (Signed)
 [place] [facility]GENC[facility] DE[facility]MENT AT [name] Provider Note   CSN: [id number] Arrival date & time: 10/01/23  1046     Patient presents with: No chief complaint on file.   Darren Nelson is a 68 y.o. male.   This is a 68 year old male presenting emergency department with vertigo and persistent headaches.  Primarily Spanish-speaking, history obtained with the aid of interpreter.  Reports near daily headaches, frontal.  Simile worsened in the evening.  Also complaining of vertigo that has been intermittent.  Associated with head movements or position changes.  Lasts for a minute or 2 and then resolved spontaneously.  No vision loss, facial droop or aphasia.  Endorses subjective paresthesia to his left proximal leg and to his left hand and forearm for the past month.  Does feel that they are slightly more weak compared to right        Prior to Admission medications   Medication Sig Start Date End Date Taking? Authorizing Provider  baclofen  5 MG TABS Take 1 tablet (5 mg total) by mouth 3 (three) times daily as needed for muscle spasms. Patient not taking: Reported on 10/01/2023 06/11/22   Malvina Ellen, MD  meloxicam  (MOBIC ) 15 MG tablet Take 1 tablet (15 mg total) by mouth daily. Patient not taking: Reported on 10/01/2023 06/11/22   Malvina Ellen, MD    Allergies: Patient has no known allergies.    Review of Systems  Updated Vital Signs BP (!) 161/91   Pulse 61   Temp 98.2 F (36.8 C) (Oral)   Resp 12   SpO2 100%   Physical Exam Vitals and nursing note reviewed.  Constitutional:      General: He is not in acute distress.    Appearance: He is obese. He is not toxic-appearing.  HENT:     Head: Normocephalic.     Nose: Nose normal.  Eyes:     Extraocular Movements: Extraocular movements intact.     Pupils: Pupils are equal, round, and reactive to light.  Cardiovascular:     Rate and Rhythm: Normal rate and regular rhythm.  Pulmonary:     Effort:  Pulmonary effort is normal.     Breath sounds: Normal breath sounds.  Abdominal:     General: Abdomen is flat. There is no distension.     Palpations: Abdomen is soft.     Tenderness: There is no abdominal tenderness. There is no guarding or rebound.  Musculoskeletal:     Cervical back: Normal range of motion.  Neurological:     Mental Status: He is alert.     Comments: No gross focal deficits.  Do not appreciate obvious strength discrepancy between left extremities and right extremities.  However does have some subjective sensorium changes.  Cranial nerves intact.  Psychiatric:        Mood and Affect: Mood normal.        Behavior: Behavior normal.     (all labs ordered are listed, but only abnormal results are displayed) Labs Reviewed  BASIC METABOLIC PANEL WITH GFR - Abnormal; Notable for the following components:      Result Value   Glucose, Bld 107 (*)    Calcium 8.6 (*)    All other components within normal limits  CBC  TROPONIN I (HIGH SENSITIVITY)    EKG: None  Radiology: MR BRAIN WO CONTRAST Result Date: 10/01/2023 EXAM: MRI BRAIN WITHOUT CONTRAST 10/01/2023 01:52:00 PM TECHNIQUE: Multiplanar multisequence MRI of the head/brain was performed without the administration of  intravenous contrast. COMPARISON: CT head 10/01/2023 CLINICAL HISTORY: Patient with a history of stroke presents with a one-month history of progressively worsening headache, dizziness when turning to the left, and weakness that started three weeks ago. No history of high blood pressure. FINDINGS: BRAIN AND VENTRICLES: Tiny chronic cortical infarct in the parasagittal left parietal lobe (series 6 image 27). No cortical encephalomalacia elsewhere. No significant white matter disease. Normal cerebral volume for age. No acute infarct. No intracranial hemorrhage. No mass. No midline shift. No hydrocephalus. The sella is unremarkable. Normal flow voids. ORBITS: No acute abnormality. SINUSES AND MASTOIDS: Mild  mucosal thickening in the paranasal sinuses. Clear mastoid air cells. BONES AND SOFT TISSUES: Normal marrow signal. No acute soft tissue abnormality. IMPRESSION: 1. No acute intracranial abnormality. 2. Tiny chronic left parietal infarct. Electronically signed by: Dasie Hamburg MD 10/01/2023 02:22 PM EDT RP Workstation: [id number]   DG Chest Portable 1 View Result Date: 10/01/2023 EXAM: 1 VIEW XRAY OF THE CHEST 10/01/2023 01:08:00 PM COMPARISON: 06/04/2021 CLINICAL HISTORY: HA's, weakness for past couple of weeks, pulm edema. FINDINGS: LUNGS AND PLEURA: No focal pulmonary opacity. No pulmonary edema. No pleural effusion. No pneumothorax. HEART AND MEDIASTINUM: Mild cardiomegaly. BONES AND SOFT TISSUES: No acute osseous abnormality. IMPRESSION: 1. No acute findings. 2. Mild cardiomegaly. Electronically signed by: Waddell Calk MD 10/01/2023 01:49 PM EDT RP Workstation: [id number]   CT Head Wo Contrast Result Date: 10/01/2023 EXAM: CT HEAD WITHOUT CONTRAST 10/01/2023 11:44:36 AM TECHNIQUE: CT of the head was performed without the administration of intravenous contrast. Automated exposure control, iterative reconstruction, and/or weight based adjustment of the mA/kV was utilized to reduce the radiation dose to as low as reasonably achievable. COMPARISON: None available. CLINICAL HISTORY: Headache, new onset (Age >= 51y). Triage notes; Pt BIB [facility] from doctors office with c/o headache that's been ongoing for the past month, progressively getting worse, meds not helping. Also reports dizziness when her turns to his left side and weakness that started about 3 weeks ago. Doctors office; sent him here. No hx of high bp. BP 192/116; HR 70; CBG 117; 98% room air. FINDINGS: BRAIN AND VENTRICLES: No acute hemorrhage. Gray-white differentiation is preserved. No hydrocephalus. No extra-axial collection. No mass effect or midline shift. ORBITS: No acute abnormality. SINUSES: No acute abnormality. SOFT TISSUES AND SKULL: No  acute soft tissue abnormality. No skull fracture. IMPRESSION: 1. No acute intracranial abnormality. Electronically signed by: Evalene Coho MD 10/01/2023 11:56 AM EDT RP Workstation: [id number]     Procedures   Medications Ordered in the ED  meclizine  (ANTIVERT ) tablet 25 mg (25 mg Oral Given 10/01/23 1149)    Clinical Course as of 10/01/23 1450  Wed Oct 01, 2023  1210 CT Head Wo Contrast Normal [TY]  1426 MR BRAIN WO CONTRAST IMPRESSION: 1. No acute intracranial abnormality. 2. Tiny chronic left parietal infarct.  Electronically signed by: Dasie Hamburg MD 10/01/2023 02:22 PM EDT RP Workstation: [id number]   [TY]  1448 Patient is feeling greatly improved after meclizine .  He did not receive migraine medicine, but notes headache is resolved and currently not having on hise.  Imaging reassuring symptoms are likely secondary to stroke or acute pathology.  Will give follow-up to neurology for his persistent headaches.  His vertigo likely secondary to peripheral cause will discharge with meclizine .  Family is asking for antihypertensives, given elevated blood pressure reading at PCP and persistently elevated readings here will start low-dose antihypertensive.  Will discharge in stable condition.  Return precautions given. [TY]  Clinical Course User Index [TY] Neysa Caron PARAS, DO                                 Medical Decision Making 68 year old male with complicated past medical history of BPH, kidney stones and hypertension presenting emergency department for headaches and vertigo.  Minor headache currently, not having vertigo sitting still in bed, but does have with position changes.  This would lead to a more peripheral cause of his vertigo.  However per chart review was sent by his PCP and has been hypertensive in the 190s, he is complaining of left extremity weakness and paresthesia although I do not appreciate obvious weakness on exam.  Will get CT head/MRI to evaluate for acute  stroke versus mass.  Will give meclizine  to treat his vertigo.  Will also get basic screening labs as well.  See ED course for further MDM and disposition.  Amount and/or Complexity of Data Reviewed Independent Historian:     Details: Family noted about taking antihypertensives. External Data Reviewed:     Details: See above Labs: ordered. Decision-making details documented in ED Course.    Details: No leukocytosis to suggest infectious process.  No anemia.  No electrolyte abnormalities.  Normal kidney function.  Troponin normal.  ACS unlikely.  EKG without ischemic changes either. Radiology: ordered and independent interpretation performed.    Details: Do not appreciate obvious intracranial pathology on CT head ECG/medicine tests: independent interpretation performed.    Details: No STEMI  Risk OTC drugs. Prescription drug management. Decision regarding hospitalization. Diagnosis or treatment significantly limited by social determinants of health. Risk Details: Nonnative English speaking.      Final diagnoses:  None    ED Discharge Orders     None          Neysa Caron PARAS, DO 10/01/23 1450

## 2023-10-01 NOTE — Patient Instructions (Signed)
 It was so good to see you today! Thank you for allowing me to take care of you.  Today we discussed the following concerns and plans:  Headache Please go to the emergency department to be evaluated immediately.  If you have any concerns, please call the clinic or schedule an appointment.  It was a pleasure to take care of you today. Be well!  Lauraine Norse, DO [facility], PGY-2  Do you need your medications delivered to your home?   We'll send your prescription to the [facility] for delivery.          Address: [address] [place], [place], [place] [address]          Phone: [phone]  Please call the [facility] to speak with a pharmacist and set up your home medication delivery. If you have any questions, feel free to contact us  -- we're happy to help!  Other [facility] that offer affordable prices on both prescriptions and over-the-counter items, as well as convenient services like vaccinations, are  [facility], at [facility]         Address:  [address], [place], [place] [address]         Phone: [phone]  [facility], located in the [facility]        Address: [address] [place], [place] [address]        Phone: [phone]  [facility], at [facility]       Address: [address], [place], [place] [address]       Phone: [phone]  [facility], at [facility]       Address: [address], [place], [place] [address]       Phone: [phone]

## 2023-10-01 NOTE — ED Notes (Signed)
 Patient transported to CT

## 2023-10-01 NOTE — Progress Notes (Signed)
    SUBJECTIVE:  Spanish interpreter used throughout this encounter.  CHIEF COMPLAINT / HPI:   HA/Dizziness Ongoing 1 month - not taking any medicines for this. 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 HA pain global, moves across from left to right and back. Lying down makes HA worse, when he rolls on his side the pain gets worse on the side he rolls to. Pain worst at night, but sometimes has pain during the day when he moves his head. Dizziness all the time, worse with standing and rapid head movements; feels like room is spinning. Sometimes so bad he has to just stand still so he does not fall. Reports he sees dark spots in vision and dark curtain coming over his eyes. No N/V.  Denies history of HTN, and does not have any elevated BPs on recent clinic visit.  Had an eye surgery many years ago in the [place] - cannot remember exactly what the surgery was. Has not had any vision trouble since then until this started.     PERTINENT  PMH / PSH: Reviewed.  OBJECTIVE:   BP (!) 179/99   Pulse 70   Ht 5' 7 (1.702 m)   Wt 231 lb (104.8 kg)   SpO2 99%   BMI 36.18 kg/m   General: well-appearing, no acute distress. HEENT: normocephalic, PERRLA, EOM grossly intact, MMM. Cardio: Regular rate, regular rhythm, no murmurs on exam. Pulm: Clear, no wheezing, no crackles. No increased work of breathing. Extremities: no peripheral edema. Moves all extremities equally.  Neuro: CN II: PERRL CN III, IV,VI: EOMI CV V: Normal sensation in V1, V2, V3 CVII: Symmetric smile and brow raise CN VIII: Normal hearing CN IX,X: Symmetric palate raise  CN XI: 5/5 shoulder shrug CN XII: Symmetric tongue protrusion  UE and LE strength 5/5 2+ UE and LE reflexes  Normal sensation in UE and LE bilaterally  No ataxia with finger to nose, normal heel to shin  Negative Rhomberg     ASSESSMENT/PLAN:   Assessment &  Plan Hypertension, unspecified type New high BP in patient without history of HTN. Now with headache and vision changes - I am concerned for stroke, hypertensive crisis, mass in brain, versus other emergent etiologies. Fortunately patient is stable and with reassuring neurological exam at this time, however I believe he needs prompt workup with head imaging. - discussed with patient and he is agreeable to EMS transport to ED for further eval - EMS arrived, pt handed off - ED charge nurse made aware     Lauraine Norse, DO [facility]

## 2023-11-04 ENCOUNTER — Ambulatory Visit (INDEPENDENT_AMBULATORY_CARE_PROVIDER_SITE_OTHER): Payer: Self-pay | Admitting: Family Medicine

## 2023-11-04 VITALS — BP 138/82 | HR 66 | Ht 67.0 in | Wt 235.4 lb

## 2023-11-04 DIAGNOSIS — I10 Essential (primary) hypertension: Secondary | ICD-10-CM | POA: Insufficient documentation

## 2023-11-04 DIAGNOSIS — Z1331 Encounter for screening for depression: Secondary | ICD-10-CM

## 2023-11-04 MED ORDER — AMLODIPINE BESYLATE 5 MG PO TABS: 5.0000 mg | ORAL_TABLET | Freq: Every day | ORAL | 0 refills | Status: DC

## 2023-11-04 NOTE — Assessment & Plan Note (Signed)
 Slightly elevated blood pressure most likely since patient has not been on medication for almost a week. Responding well to amlodipine  otherwise.  - Refilled amlodipine .

## 2023-11-04 NOTE — Patient Instructions (Addendum)
  Ya le resurt la medicacin. Por favor, vuelva a consultarnos dentro de un mes para controlar su presin arterial y fatiga.  I have refilled your medication. Please follow up in one month for follow up of your blood pressure and fatigue.

## 2023-11-04 NOTE — Progress Notes (Unsigned)
    SUBJECTIVE:   CHIEF COMPLAINT / HPI:   Hypertension  Patient reports he has been out of his medication for the last 5 days. He has a little bit of a headache since not taking his medication. He is on amlodipine  5 mg. He does not have trouble affording medication.    Positive depression screening  Screening positive due to lot of fatigue and tiredness. Patient does not fall asleep during the day. He wakes up at 5 am in the morning. He sleeps at 11 at night. Husband says that wife is bad in her head so he has to wake up. She has night mares and struggles to sleep. Says he snores but no apneic episodes at night or choking feeling. Feels rested when he wakes up if he sleeps enough.     PERTINENT  PMH / PSH: HTN,   OBJECTIVE:   BP 138/82   Pulse 66   Ht 5' 7 (1.702 m)   Wt 235 lb 6.4 oz (106.8 kg)   SpO2 98%   BMI 36.87 kg/m   General: well appearing, in no acute distress CV: well perfused  Resp: Normal work of breathing on room air Psyc: conversant, pleasant   ASSESSMENT/PLAN:   Assessment & Plan Primary hypertension Slightly elevated blood pressure most likely since patient has not been on medication for almost a week. Responding well to amlodipine  otherwise.  - Refilled amlodipine .  Positive depression screening      Areta Saliva, MD [facility]

## 2023-11-20 ENCOUNTER — Ambulatory Visit

## 2023-12-16 ENCOUNTER — Other Ambulatory Visit: Payer: Self-pay

## 2023-12-16 ENCOUNTER — Other Ambulatory Visit: Payer: Self-pay | Admitting: Family Medicine

## 2023-12-16 DIAGNOSIS — I10 Essential (primary) hypertension: Secondary | ICD-10-CM

## 2023-12-16 NOTE — Telephone Encounter (Signed)
 Pt is back and saying refill is not at the pharmacy he is asking for medication to be filled.

## 2023-12-17 MED ORDER — AMLODIPINE BESYLATE 5 MG PO TABS: 5.0000 mg | ORAL_TABLET | Freq: Every day | ORAL | 0 refills | Status: DC

## 2024-01-10 ENCOUNTER — Other Ambulatory Visit: Payer: Self-pay

## 2024-01-10 DIAGNOSIS — I10 Essential (primary) hypertension: Secondary | ICD-10-CM
